# Patient Record
Sex: Female | Born: 1995 | Race: White | Hispanic: No | Smoking: Current every day smoker
Health system: Southern US, Community
[De-identification: ages and names within clinical notes are randomized; demographics above are authoritative.]

## PROBLEM LIST (undated history)

## (undated) ENCOUNTER — Inpatient Hospital Stay (HOSPITAL_COMMUNITY): Payer: Self-pay

## (undated) DIAGNOSIS — R87629 Unspecified abnormal cytological findings in specimens from vagina: Secondary | ICD-10-CM

## (undated) DIAGNOSIS — J45909 Unspecified asthma, uncomplicated: Secondary | ICD-10-CM

## (undated) DIAGNOSIS — B999 Unspecified infectious disease: Secondary | ICD-10-CM

## (undated) DIAGNOSIS — R51 Headache: Secondary | ICD-10-CM

## (undated) DIAGNOSIS — R519 Headache, unspecified: Secondary | ICD-10-CM

## (undated) DIAGNOSIS — Z789 Other specified health status: Secondary | ICD-10-CM

## (undated) DIAGNOSIS — F1910 Other psychoactive substance abuse, uncomplicated: Secondary | ICD-10-CM

## (undated) DIAGNOSIS — K219 Gastro-esophageal reflux disease without esophagitis: Secondary | ICD-10-CM

## (undated) DIAGNOSIS — F419 Anxiety disorder, unspecified: Secondary | ICD-10-CM

## (undated) DIAGNOSIS — F1110 Opioid abuse, uncomplicated: Secondary | ICD-10-CM

## (undated) HISTORY — PX: TONSILLECTOMY: SUR1361

---

## 2014-04-23 ENCOUNTER — Other Ambulatory Visit (HOSPITAL_COMMUNITY): Payer: Self-pay | Admitting: Obstetrics and Gynecology

## 2014-04-23 DIAGNOSIS — Z3A13 13 weeks gestation of pregnancy: Secondary | ICD-10-CM

## 2014-04-23 DIAGNOSIS — O283 Abnormal ultrasonic finding on antenatal screening of mother: Secondary | ICD-10-CM

## 2014-04-26 ENCOUNTER — Other Ambulatory Visit (HOSPITAL_COMMUNITY): Payer: Self-pay | Admitting: Obstetrics and Gynecology

## 2014-04-26 ENCOUNTER — Encounter (HOSPITAL_COMMUNITY): Payer: Self-pay

## 2014-04-26 ENCOUNTER — Ambulatory Visit (HOSPITAL_COMMUNITY)
Admission: RE | Admit: 2014-04-26 | Discharge: 2014-04-26 | Disposition: A | Discharge status: Home / Self Care | Payer: Medicaid Other | Source: Ambulatory Visit | Attending: Obstetrics and Gynecology | Admitting: Obstetrics and Gynecology

## 2014-04-26 ENCOUNTER — Encounter (HOSPITAL_COMMUNITY): Payer: Self-pay | Admitting: Obstetrics and Gynecology

## 2014-04-26 DIAGNOSIS — Z3A13 13 weeks gestation of pregnancy: Secondary | ICD-10-CM

## 2014-04-26 DIAGNOSIS — O358XX0 Maternal care for other (suspected) fetal abnormality and damage, not applicable or unspecified: Secondary | ICD-10-CM | POA: Insufficient documentation

## 2014-04-26 DIAGNOSIS — Z1389 Encounter for screening for other disorder: Secondary | ICD-10-CM | POA: Insufficient documentation

## 2014-04-26 DIAGNOSIS — O35DXX0 Maternal care for other (suspected) fetal abnormality and damage, fetal gastrointestinal anomalies, not applicable or unspecified: Secondary | ICD-10-CM | POA: Insufficient documentation

## 2014-04-26 DIAGNOSIS — O283 Abnormal ultrasonic finding on antenatal screening of mother: Secondary | ICD-10-CM | POA: Insufficient documentation

## 2014-04-26 DIAGNOSIS — O289 Unspecified abnormal findings on antenatal screening of mother: Secondary | ICD-10-CM | POA: Insufficient documentation

## 2014-04-26 HISTORY — DX: Other specified health status: Z78.9

## 2014-04-26 LAB — US MFM OB COMP LESS THAN 14 WEEKS

## 2014-04-26 NOTE — ED Notes (Signed)
Pt reports pelvic cramping.

## 2014-05-07 ENCOUNTER — Other Ambulatory Visit (HOSPITAL_COMMUNITY): Payer: Self-pay

## 2014-05-31 ENCOUNTER — Ambulatory Visit (HOSPITAL_COMMUNITY)
Admission: RE | Admit: 2014-05-31 | Discharge: 2014-05-31 | Disposition: A | Discharge status: Home / Self Care | Payer: Medicaid Other | Source: Ambulatory Visit | Attending: Obstetrics and Gynecology | Admitting: Obstetrics and Gynecology

## 2014-05-31 DIAGNOSIS — O358XX0 Maternal care for other (suspected) fetal abnormality and damage, not applicable or unspecified: Secondary | ICD-10-CM | POA: Insufficient documentation

## 2014-05-31 DIAGNOSIS — O289 Unspecified abnormal findings on antenatal screening of mother: Secondary | ICD-10-CM | POA: Diagnosis present

## 2014-05-31 DIAGNOSIS — O35DXX0 Maternal care for other (suspected) fetal abnormality and damage, fetal gastrointestinal anomalies, not applicable or unspecified: Secondary | ICD-10-CM

## 2014-05-31 DIAGNOSIS — O283 Abnormal ultrasonic finding on antenatal screening of mother: Secondary | ICD-10-CM

## 2014-05-31 DIAGNOSIS — Z3A13 13 weeks gestation of pregnancy: Secondary | ICD-10-CM | POA: Diagnosis not present

## 2014-05-31 DIAGNOSIS — Z3A18 18 weeks gestation of pregnancy: Secondary | ICD-10-CM | POA: Insufficient documentation

## 2014-05-31 DIAGNOSIS — Z3689 Encounter for other specified antenatal screening: Secondary | ICD-10-CM | POA: Insufficient documentation

## 2014-06-13 ENCOUNTER — Other Ambulatory Visit (HOSPITAL_COMMUNITY): Payer: Self-pay

## 2014-06-19 ENCOUNTER — Telehealth (HOSPITAL_COMMUNITY): Payer: Self-pay | Admitting: Genetics

## 2014-06-19 NOTE — Telephone Encounter (Signed)
Called Amanda Ballard to discuss the option of a pediatric surgery prenatal consult.  She stated that her doctor had recently [facility].  She stated that she was comfortable with those services and that [facility]. She stated she had no further questions or concerns.

## 2014-06-28 ENCOUNTER — Ambulatory Visit (HOSPITAL_COMMUNITY)
Admission: RE | Admit: 2014-06-28 | Discharge: 2014-06-28 | Disposition: A | Discharge status: Home / Self Care | Payer: Medicaid Other | Source: Ambulatory Visit | Attending: Obstetrics and Gynecology | Admitting: Obstetrics and Gynecology

## 2014-06-28 ENCOUNTER — Encounter (HOSPITAL_COMMUNITY): Payer: Self-pay

## 2014-06-28 DIAGNOSIS — O358XX0 Maternal care for other (suspected) fetal abnormality and damage, not applicable or unspecified: Secondary | ICD-10-CM

## 2014-06-28 DIAGNOSIS — IMO0002 Reserved for concepts with insufficient information to code with codable children: Secondary | ICD-10-CM | POA: Insufficient documentation

## 2014-06-28 DIAGNOSIS — Z36 Encounter for antenatal screening of mother: Secondary | ICD-10-CM | POA: Insufficient documentation

## 2014-06-28 DIAGNOSIS — O360920 Maternal care for other rhesus isoimmunization, second trimester, not applicable or unspecified: Secondary | ICD-10-CM | POA: Diagnosis not present

## 2014-06-28 DIAGNOSIS — Z3A22 22 weeks gestation of pregnancy: Secondary | ICD-10-CM | POA: Insufficient documentation

## 2014-06-28 DIAGNOSIS — O35DXX0 Maternal care for other (suspected) fetal abnormality and damage, fetal gastrointestinal anomalies, not applicable or unspecified: Secondary | ICD-10-CM

## 2014-06-28 DIAGNOSIS — Z0489 Encounter for examination and observation for other specified reasons: Secondary | ICD-10-CM | POA: Insufficient documentation

## 2014-06-28 DIAGNOSIS — O359XX0 Maternal care for (suspected) fetal abnormality and damage, unspecified, not applicable or unspecified: Secondary | ICD-10-CM | POA: Diagnosis not present

## 2014-08-05 ENCOUNTER — Inpatient Hospital Stay (HOSPITAL_COMMUNITY)
Admission: AD | Admit: 2014-08-05 | Discharge: 2014-08-05 | Disposition: A | Discharge status: Home / Self Care | Payer: Medicaid Other | Source: Ambulatory Visit | Attending: Obstetrics and Gynecology | Admitting: Obstetrics and Gynecology

## 2014-08-05 ENCOUNTER — Encounter (HOSPITAL_COMMUNITY): Payer: Self-pay | Admitting: *Deleted

## 2014-08-05 DIAGNOSIS — Z3A27 27 weeks gestation of pregnancy: Secondary | ICD-10-CM | POA: Diagnosis not present

## 2014-08-05 DIAGNOSIS — O368121 Decreased fetal movements, second trimester, fetus 1: Secondary | ICD-10-CM | POA: Diagnosis not present

## 2014-08-05 DIAGNOSIS — Z87891 Personal history of nicotine dependence: Secondary | ICD-10-CM | POA: Insufficient documentation

## 2014-08-05 DIAGNOSIS — O368120 Decreased fetal movements, second trimester, not applicable or unspecified: Secondary | ICD-10-CM | POA: Diagnosis present

## 2014-08-05 HISTORY — DX: Unspecified abnormal cytological findings in specimens from vagina: R87.629

## 2014-08-05 HISTORY — DX: Unspecified asthma, uncomplicated: J45.909

## 2014-08-05 HISTORY — DX: Unspecified infectious disease: B99.9

## 2014-08-05 HISTORY — DX: Gastro-esophageal reflux disease without esophagitis: K21.9

## 2014-08-05 HISTORY — DX: Headache: R51

## 2014-08-05 HISTORY — DX: Anxiety disorder, unspecified: F41.9

## 2014-08-05 HISTORY — DX: Headache, unspecified: R51.9

## 2014-08-05 LAB — RAPID URINE DRUG SCREEN, HOSP PERFORMED
Amphetamines: NOT DETECTED
Barbiturates: NOT DETECTED
Benzodiazepines: NOT DETECTED
Cocaine: NOT DETECTED
Opiates: NOT DETECTED
Tetrahydrocannabinol: NOT DETECTED

## 2014-08-05 MED ORDER — ONDANSETRON 8 MG PO TBDP
8.0000 mg | ORAL_TABLET | Freq: Two times a day (BID) | ORAL | Status: DC
  Administered 2014-08-05: 8 mg via ORAL
  Filled 2014-08-05: qty 1

## 2014-08-05 MED ORDER — PROMETHAZINE HCL 25 MG/ML IJ SOLN
25.0000 mg | INTRAMUSCULAR | Status: DC | PRN
  Administered 2014-08-05: 25 mg via INTRAMUSCULAR
  Filled 2014-08-05: qty 1

## 2014-08-05 NOTE — MAU Note (Signed)
PT ARRIVED- SAYS IN TRIAGE - BABY HAD NOT MOVED  SINCE YESTERDAY AFTERNOON-   UNABLE  TO GET [facility]- TOOK TO [facility]-  STILL UNABLE TO GET [facility]- CALLED DARLENE, CNM INTO    ROOM-    [facility]- 184 IN LEFT LOWER QUAD.   HAS HAD VOMITING  X2 HRS.    GETS [facility]-  IN [place]-  BABY HAS GASTRSHCESIS.-   COULD  SEE MOVEMENT IN TRIAGE.

## 2014-08-05 NOTE — MAU Provider Note (Signed)
  History   G1 at 27 wks in with c/o not feeling baby move since yesterday also c/o nauseas and vomiting since one hour ago. Is beeing seen in [facility] secondary to fetal gastroschesis.  CSN: [id number]  Arrival date and time: 08/05/14 2045   First Provider Initiated Contact with Patient 08/05/14 2100      No chief complaint on file.  HPI  OB History    Gravida Para Term Preterm AB TAB SAB Ectopic Multiple Living   1               Past Medical History  Diagnosis Date  . Medical history non-contributory     Past Surgical History  Procedure Laterality Date  . Tonsillectomy      No family history on file.  History  Substance Use Topics  . Smoking status: Former Games developermoker  . Smokeless tobacco: Not on file  . Alcohol Use: No    Allergies: No Known Allergies  Prescriptions prior to admission  Medication Sig Dispense Refill Last Dose  . CLONAZEPAM PO Take by mouth.   Taking  . Prenatal Vit-Fe Fumarate-FA (PRENATAL VITAMIN PO) Take by mouth.   Taking  . Sertraline HCl (ZOLOFT PO) Take by mouth.   Taking    Review of Systems  Constitutional: Negative.   HENT: Negative.   Eyes: Negative.   Respiratory: Negative.   Cardiovascular: Negative.   Gastrointestinal: Positive for nausea and vomiting.  Genitourinary: Negative.   Musculoskeletal: Negative.   Skin: Negative.   Neurological: Negative.   Endo/Heme/Allergies: Negative.   Psychiatric/Behavioral: Negative.    Physical Exam   Last menstrual period 01/24/2014.  Physical Exam  Constitutional: She is oriented to person, place, and time. She appears well-developed and well-nourished.  HENT:  Head: Normocephalic.  Eyes: Pupils are equal, round, and reactive to light.  Neck: Normal range of motion.  Cardiovascular: Normal rate, regular rhythm, normal heart sounds and intact distal pulses.   Respiratory: Effort normal and breath sounds normal.  GI: Soft. Bowel sounds are normal.  Neurological: She is alert and  oriented to person, place, and time. She has normal reflexes.  Skin: Skin is warm and dry.  Psychiatric: She has a normal mood and affect. Her behavior is normal. Judgment and thought content normal.    MAU Course  Procedures  MDM Decreased fetal movement.  Assessment and Plan  EFM and antiemetics  Garret Teale DARLENE 08/05/2014, 9:04 PM

## 2014-08-05 NOTE — MAU Note (Signed)
Pt states that the n/v is resolved. She is feeling baby move now, but just not as much as usual.

## 2014-08-05 NOTE — Discharge Instructions (Signed)

## 2014-08-19 ENCOUNTER — Inpatient Hospital Stay (HOSPITAL_COMMUNITY)
Admission: AD | Admit: 2014-08-19 | Discharge: 2014-08-19 | Disposition: A | Discharge status: Home / Self Care | Payer: Medicaid Other | Source: Ambulatory Visit | Attending: Obstetrics & Gynecology | Admitting: Obstetrics & Gynecology

## 2014-08-19 DIAGNOSIS — Z87891 Personal history of nicotine dependence: Secondary | ICD-10-CM | POA: Insufficient documentation

## 2014-08-19 DIAGNOSIS — F419 Anxiety disorder, unspecified: Secondary | ICD-10-CM | POA: Diagnosis not present

## 2014-08-19 DIAGNOSIS — N898 Other specified noninflammatory disorders of vagina: Secondary | ICD-10-CM | POA: Insufficient documentation

## 2014-08-19 DIAGNOSIS — O99343 Other mental disorders complicating pregnancy, third trimester: Secondary | ICD-10-CM | POA: Insufficient documentation

## 2014-08-19 DIAGNOSIS — O9989 Other specified diseases and conditions complicating pregnancy, childbirth and the puerperium: Secondary | ICD-10-CM | POA: Diagnosis not present

## 2014-08-19 DIAGNOSIS — B9689 Other specified bacterial agents as the cause of diseases classified elsewhere: Secondary | ICD-10-CM

## 2014-08-19 DIAGNOSIS — A499 Bacterial infection, unspecified: Secondary | ICD-10-CM | POA: Diagnosis not present

## 2014-08-19 DIAGNOSIS — N760 Acute vaginitis: Secondary | ICD-10-CM | POA: Diagnosis not present

## 2014-08-19 DIAGNOSIS — R109 Unspecified abdominal pain: Secondary | ICD-10-CM | POA: Insufficient documentation

## 2014-08-19 DIAGNOSIS — Z3A29 29 weeks gestation of pregnancy: Secondary | ICD-10-CM | POA: Insufficient documentation

## 2014-08-19 LAB — URINALYSIS, ROUTINE W REFLEX MICROSCOPIC
Bilirubin Urine: NEGATIVE
Glucose, UA: NEGATIVE mg/dL
Hgb urine dipstick: NEGATIVE
Ketones, ur: NEGATIVE mg/dL
Nitrite: NEGATIVE
PH: 5.5 (ref 5.0–8.0)
PROTEIN: NEGATIVE mg/dL
Specific Gravity, Urine: <1.005 — ABNORMAL LOW (ref 1.005–1.030)
Urobilinogen, UA: 0.2 mg/dL (ref 0.0–1.0)

## 2014-08-19 LAB — POCT FERN TEST: POCT Fern Test: NEGATIVE

## 2014-08-19 LAB — WET PREP, GENITAL
Trich, Wet Prep: NONE SEEN
Yeast Wet Prep HPF POC: NONE SEEN

## 2014-08-19 LAB — URINE MICROSCOPIC-ADD ON

## 2014-08-19 MED ORDER — METRONIDAZOLE 500 MG PO TABS: 500.0000 mg | ORAL_TABLET | Freq: Three times a day (TID) | ORAL | Status: DC

## 2014-08-19 NOTE — MAU Note (Signed)
Pts mother requesting AFI. Patient is not requesting an AFI and refuses an amnisure when offered.

## 2014-08-19 NOTE — Discharge Instructions (Signed)
Bacterial Vaginosis Bacterial vaginosis is a vaginal infection that occurs when the normal balance of bacteria in the vagina is disrupted. It results from an overgrowth of certain bacteria. This is the most common vaginal infection in women of childbearing age. Treatment is important to prevent complications, especially in pregnant women, as it can cause a premature delivery. CAUSES  Bacterial vaginosis is caused by an increase in harmful bacteria that are normally present in smaller amounts in the vagina. Several different kinds of bacteria can cause bacterial vaginosis. However, the reason that the condition develops is not fully understood. RISK FACTORS Certain activities or behaviors can put you at an increased risk of developing bacterial vaginosis, including:  Having a new sex partner or multiple sex partners.  Douching.  Using an intrauterine device (IUD) for contraception. Women do not get bacterial vaginosis from toilet seats, bedding, swimming pools, or contact with objects around them. SIGNS AND SYMPTOMS  Some women with bacterial vaginosis have no signs or symptoms. Common symptoms include:  Grey vaginal discharge.  A fishlike odor with discharge, especially after sexual intercourse.  Itching or burning of the vagina and vulva.  Burning or pain with urination. DIAGNOSIS  Your health care provider will take a medical history and examine the vagina for signs of bacterial vaginosis. A sample of vaginal fluid may be taken. Your health care provider will look at this sample under a microscope to check for bacteria and abnormal cells. A vaginal pH test may also be done.  TREATMENT  Bacterial vaginosis may be treated with antibiotic medicines. These may be given in the form of a pill or a vaginal cream. A second round of antibiotics may be prescribed if the condition comes back after treatment.  HOME CARE INSTRUCTIONS   Only take over-the-counter or prescription medicines as  directed by your health care provider.  If antibiotic medicine was prescribed, take it as directed. Make sure you finish it even if you start to feel better.  Do not have sex until treatment is completed.  Tell all sexual partners that you have a vaginal infection. They should see their health care provider and be treated if they have problems, such as a mild rash or itching.  Practice safe sex by using condoms and only having one sex partner. SEEK MEDICAL CARE IF:   Your symptoms are not improving after 3 days of treatment.  You have increased discharge or pain.  You have a fever. MAKE SURE YOU:   Understand these instructions.  Will watch your condition.  Will get help right away if you are not doing well or get worse. FOR MORE INFORMATION  Centers for Disease Control and Prevention, Division of STD Prevention: www.cdc.gov/std American Sexual Health Association (ASHA): www.ashastd.org  Document Released: 05/10/2005 Document Revised: 02/28/2013 Document Reviewed: 12/20/2012 ExitCare Patient Information 2015 [employer]. This information is not intended to replace advice given to you by your health care provider. Make sure you discuss any questions you have with your health care provider.  

## 2014-08-19 NOTE — MAU Note (Signed)
Pt reports she has had a lot of back pain and pressure today, at 1930 she had a gush of fluid.

## 2014-08-19 NOTE — MAU Provider Note (Signed)
  History   G1 at 29 wks in with c/o abd cramping, sharp shooting pain in vagina, and gush of fluid x 1 this afternoon. [facility] has [facility]hesis  CSN: [id number]  Arrival date and time: 08/19/14 1955   None     No chief complaint on file.  HPI  OB History    Gravida Para Term Preterm AB TAB SAB Ectopic Multiple Living   1               Past Medical History  Diagnosis Date  . Medical history non-contributory   . Anxiety   . Asthma   . Vaginal Pap smear, abnormal   . GERD (gastroesophageal reflux disease)   . Headache   . Infection     UTI    Past Surgical History  Procedure Laterality Date  . Tonsillectomy      No family history on file.  History  Substance Use Topics  . Smoking status: Former Games developermoker  . Smokeless tobacco: Never Used  . Alcohol Use: No     Comment: on occassion when not pregnant     Allergies: No Known Allergies  Prescriptions prior to admission  Medication Sig Dispense Refill Last Dose  . clonazePAM (KLONOPIN) 0.5 MG tablet Take 0.5 mg by mouth at bedtime as needed for anxiety (sleep).   08/05/2014 at Unknown time  . Prenatal Vit-Fe Fumarate-FA (PRENATAL VITAMIN PO) Take 1 tablet by mouth daily.    08/04/2014 at Unknown time  . sertraline (ZOLOFT) 25 MG tablet Take 37.5 mg by mouth daily.   08/05/2014 at Unknown time    Review of Systems  Constitutional: Negative.   HENT: Negative.   Eyes: Negative.   Respiratory: Negative.   Cardiovascular: Negative.   Gastrointestinal: Positive for abdominal pain.  Genitourinary:       Yellowish vag discharge, with ammine odor  Musculoskeletal: Negative.   Skin: Negative.   Neurological: Negative.   Endo/Heme/Allergies: Negative.   Psychiatric/Behavioral: Negative.    Physical Exam   Blood pressure 122/72, pulse 95, temperature 97.7 F (36.5 C), temperature source Oral, resp. rate 18, height 5\' 3"  (1.6 m), weight 130 lb 4 oz (59.081 kg), last menstrual period 01/24/2014,  SpO2 100 %.  Physical Exam  Constitutional: She is oriented to person, place, and time. She appears well-developed and well-nourished.  HENT:  Head: Normocephalic.  Eyes: Pupils are equal, round, and reactive to light.  Neck: Normal range of motion.  Cardiovascular: Normal rate, regular rhythm, normal heart sounds and intact distal pulses.   Respiratory: Effort normal and breath sounds normal.  GI: Soft. Bowel sounds are normal.  Genitourinary: Uterus normal.  Musculoskeletal: Normal range of motion.  Neurological: She is alert and oriented to person, place, and time. She has normal reflexes.  Skin: Skin is warm and dry.  Psychiatric: She has a normal mood and affect. Her behavior is normal. Judgment and thought content normal.    MAU Course  Procedures  MDM D/c home  Assessment and Plan  Sterile spec exam done no active leaking, no loss of fluid with valsalva or cough. Malodorous vag discharge, wet prep, gc, chla done. Cervix closed, th,post and high. Will d/c home  Wyvonnia DuskyLAWSON, Amanda Wellen DARLENE 08/19/2014, 8:23 PM

## 2014-08-19 NOTE — MAU Note (Signed)
Patient refuses RPR and HIV blood tests today

## 2014-08-20 LAB — GC/CHLAMYDIA PROBE AMP (~~LOC~~) NOT AT ARMC
CHLAMYDIA, DNA PROBE: NEGATIVE
NEISSERIA GONORRHEA: NEGATIVE

## 2015-03-01 ENCOUNTER — Encounter (HOSPITAL_COMMUNITY): Payer: Self-pay | Admitting: *Deleted

## 2015-12-18 ENCOUNTER — Ambulatory Visit: Payer: Medicaid Other | Admitting: Certified Nurse Midwife

## 2017-01-14 ENCOUNTER — Emergency Department (HOSPITAL_COMMUNITY)
Admission: EM | Admit: 2017-01-14 | Discharge: 2017-01-14 | Disposition: A | Discharge status: Home / Self Care | Payer: Medicaid Other | Attending: Emergency Medicine | Admitting: Emergency Medicine

## 2017-01-14 ENCOUNTER — Encounter (HOSPITAL_COMMUNITY): Payer: Self-pay | Admitting: Emergency Medicine

## 2017-01-14 DIAGNOSIS — Z5321 Procedure and treatment not carried out due to patient leaving prior to being seen by health care provider: Secondary | ICD-10-CM | POA: Diagnosis not present

## 2017-01-14 DIAGNOSIS — Z7151 Drug abuse counseling and surveillance of drug abuser: Secondary | ICD-10-CM | POA: Insufficient documentation

## 2017-01-14 LAB — COMPREHENSIVE METABOLIC PANEL
ALBUMIN: 4.2 g/dL (ref 3.5–5.0)
ALT: 28 U/L (ref 14–54)
ANION GAP: 8 (ref 5–15)
AST: 20 U/L (ref 15–41)
Alkaline Phosphatase: 63 U/L (ref 38–126)
BUN: 11 mg/dL (ref 6–20)
CO2: 28 mmol/L (ref 22–32)
CREATININE: 0.7 mg/dL (ref 0.44–1.00)
Calcium: 9.9 mg/dL (ref 8.9–10.3)
Chloride: 104 mmol/L (ref 101–111)
GFR calc Af Amer: >60 mL/min (ref >60)
GFR calc non Af Amer: >60 mL/min (ref >60)
GLUCOSE: 97 mg/dL (ref 65–99)
Potassium: 3.8 mmol/L (ref 3.5–5.1)
SODIUM: 140 mmol/L (ref 135–145)
Total Bilirubin: 0.5 mg/dL (ref 0.3–1.2)
Total Protein: 7.3 g/dL (ref 6.5–8.1)

## 2017-01-14 LAB — CBC
HCT: 42.2 % (ref 36.0–46.0)
Hemoglobin: 14 g/dL (ref 12.0–15.0)
MCH: 27.6 pg (ref 26.0–34.0)
MCHC: 33.2 g/dL (ref 30.0–36.0)
MCV: 83.1 fL (ref 78.0–100.0)
Platelets: 258 10*3/uL (ref 150–400)
RBC: 5.08 MIL/uL (ref 3.87–5.11)
RDW: 13 % (ref 11.5–15.5)
WBC: 6.2 10*3/uL (ref 4.0–10.5)

## 2017-01-14 LAB — RAPID URINE DRUG SCREEN, HOSP PERFORMED
Amphetamines: NOT DETECTED
Barbiturates: NOT DETECTED
Benzodiazepines: POSITIVE — AB
Cocaine: NOT DETECTED
Opiates: POSITIVE — AB
Tetrahydrocannabinol: NOT DETECTED

## 2017-01-14 LAB — ETHANOL: Alcohol, Ethyl (B): <5 mg/dL (ref <5)

## 2017-01-14 NOTE — ED Notes (Signed)
No answer when called, pt discharged out of system

## 2017-01-14 NOTE — ED Notes (Signed)
Called and no answer.

## 2017-01-14 NOTE — ED Provider Notes (Signed)
Pt reportedly here for detox. Pt's name was moved to Pod E room however never actually came back, per nursing notes she apparently left without being seen after triage, no answer on multiple calls in lobby. Pt was then discharged from the system. I DID NOT SEE THIS PT AS SHE LEFT WITHOUT BEING SEEN. Review of labs reveals unremarkable CMP and CBC, EtOH level undetectable, and UDS with +opiates and benzos. No concerning lab findings that would require calling her back to have her return. No further action required.   Signed at 6:11 PM by [address], [place], [place] 01/14/17 1812    Tegeler, Canary Brim, MD 01/14/17 [phone]

## 2017-01-14 NOTE — ED Triage Notes (Signed)
Pt states, "I need to get into a detox program before Monday." Pt reports being caught by police in possession of heroin yesterday, reports taking 6 xanax to "so the cops wouldn't find them."  Pt reports last heroin use yesterday. Pt's family friend reports pt taken to [facility] yesterday and released to find detox program.  Pt denies SI/ HI, hopeful for detox.

## 2017-01-14 NOTE — ED Notes (Signed)
Pt called for vital signs x4.  No response.

## 2017-03-09 ENCOUNTER — Encounter (HOSPITAL_COMMUNITY): Payer: Self-pay

## 2017-03-09 LAB — OB RESULTS CONSOLE ABO/RH: RH TYPE: NEGATIVE

## 2017-03-09 LAB — OB RESULTS CONSOLE PLATELET COUNT: Platelets: 207

## 2017-03-09 LAB — OB RESULTS CONSOLE ANTIBODY SCREEN: Antibody Screen: NEGATIVE

## 2017-03-09 LAB — OB RESULTS CONSOLE HGB/HCT, BLOOD
HCT: 37
Hemoglobin: 12.6

## 2017-03-09 LAB — OB RESULTS CONSOLE VARICELLA ZOSTER ANTIBODY, IGG: VARICELLA IGG: IMMUNE

## 2017-03-09 LAB — OB RESULTS CONSOLE HEPATITIS B SURFACE ANTIGEN: HEP B S AG: NEGATIVE

## 2017-03-09 LAB — OB RESULTS CONSOLE RUBELLA ANTIBODY, IGM: Rubella: IMMUNE

## 2017-03-09 LAB — OB RESULTS CONSOLE RPR: RPR: NONREACTIVE

## 2017-03-13 ENCOUNTER — Other Ambulatory Visit: Payer: Self-pay

## 2017-04-07 ENCOUNTER — Other Ambulatory Visit: Payer: Self-pay

## 2017-05-05 ENCOUNTER — Encounter (HOSPITAL_COMMUNITY): Payer: Self-pay

## 2017-05-06 ENCOUNTER — Encounter (HOSPITAL_COMMUNITY): Payer: Self-pay

## 2017-05-06 ENCOUNTER — Other Ambulatory Visit (HOSPITAL_COMMUNITY): Payer: Self-pay | Admitting: Obstetrics and Gynecology

## 2017-05-06 ENCOUNTER — Ambulatory Visit (HOSPITAL_COMMUNITY): Payer: Self-pay

## 2017-05-06 DIAGNOSIS — Z3689 Encounter for other specified antenatal screening: Secondary | ICD-10-CM

## 2017-05-10 ENCOUNTER — Encounter (HOSPITAL_COMMUNITY): Payer: Self-pay | Admitting: *Deleted

## 2017-05-12 ENCOUNTER — Ambulatory Visit (HOSPITAL_COMMUNITY)
Admission: RE | Admit: 2017-05-12 | Discharge: 2017-05-12 | Disposition: A | Discharge status: Home / Self Care | Payer: Medicaid Other | Source: Ambulatory Visit | Attending: Obstetrics and Gynecology | Admitting: Obstetrics and Gynecology

## 2017-05-12 ENCOUNTER — Encounter (HOSPITAL_COMMUNITY): Payer: Self-pay

## 2017-05-12 ENCOUNTER — Other Ambulatory Visit (HOSPITAL_COMMUNITY): Payer: Self-pay | Admitting: Obstetrics and Gynecology

## 2017-05-12 DIAGNOSIS — O99322 Drug use complicating pregnancy, second trimester: Secondary | ICD-10-CM | POA: Insufficient documentation

## 2017-05-12 DIAGNOSIS — O359XX0 Maternal care for (suspected) fetal abnormality and damage, unspecified, not applicable or unspecified: Secondary | ICD-10-CM

## 2017-05-12 DIAGNOSIS — O9989 Other specified diseases and conditions complicating pregnancy, childbirth and the puerperium: Secondary | ICD-10-CM | POA: Diagnosis not present

## 2017-05-12 DIAGNOSIS — Z3A15 15 weeks gestation of pregnancy: Secondary | ICD-10-CM | POA: Insufficient documentation

## 2017-05-12 DIAGNOSIS — J45909 Unspecified asthma, uncomplicated: Secondary | ICD-10-CM | POA: Diagnosis not present

## 2017-05-12 DIAGNOSIS — Z3689 Encounter for other specified antenatal screening: Secondary | ICD-10-CM

## 2017-05-12 HISTORY — DX: Other psychoactive substance abuse, uncomplicated: F19.10

## 2017-05-12 HISTORY — DX: Opioid abuse, uncomplicated: F11.10

## 2017-05-31 ENCOUNTER — Other Ambulatory Visit (HOSPITAL_COMMUNITY): Payer: Self-pay | Admitting: *Deleted

## 2017-05-31 DIAGNOSIS — O359XX0 Maternal care for (suspected) fetal abnormality and damage, unspecified, not applicable or unspecified: Secondary | ICD-10-CM

## 2017-06-07 ENCOUNTER — Encounter (HOSPITAL_COMMUNITY): Payer: Self-pay

## 2017-06-07 ENCOUNTER — Other Ambulatory Visit (HOSPITAL_COMMUNITY): Payer: Self-pay | Admitting: Maternal and Fetal Medicine

## 2017-06-07 ENCOUNTER — Ambulatory Visit (HOSPITAL_COMMUNITY)
Admission: RE | Admit: 2017-06-07 | Discharge: 2017-06-07 | Disposition: A | Discharge status: Home / Self Care | Payer: Medicaid Other | Source: Ambulatory Visit | Attending: Obstetrics and Gynecology | Admitting: Obstetrics and Gynecology

## 2017-06-07 ENCOUNTER — Ambulatory Visit (HOSPITAL_COMMUNITY): Admission: RE | Admit: 2017-06-07 | Payer: Medicaid Other | Source: Ambulatory Visit

## 2017-06-07 DIAGNOSIS — O09299 Supervision of pregnancy with other poor reproductive or obstetric history, unspecified trimester: Secondary | ICD-10-CM

## 2017-06-07 DIAGNOSIS — O9989 Other specified diseases and conditions complicating pregnancy, childbirth and the puerperium: Secondary | ICD-10-CM | POA: Diagnosis not present

## 2017-06-07 DIAGNOSIS — M2609 Other specified anomalies of jaw size: Secondary | ICD-10-CM | POA: Insufficient documentation

## 2017-06-07 DIAGNOSIS — Z362 Encounter for other antenatal screening follow-up: Secondary | ICD-10-CM

## 2017-06-07 DIAGNOSIS — J45909 Unspecified asthma, uncomplicated: Secondary | ICD-10-CM | POA: Diagnosis not present

## 2017-06-07 DIAGNOSIS — I517 Cardiomegaly: Secondary | ICD-10-CM | POA: Diagnosis not present

## 2017-06-07 DIAGNOSIS — Q270 Congenital absence and hypoplasia of umbilical artery: Secondary | ICD-10-CM | POA: Diagnosis not present

## 2017-06-07 DIAGNOSIS — G9389 Other specified disorders of brain: Secondary | ICD-10-CM | POA: Diagnosis not present

## 2017-06-07 DIAGNOSIS — O99352 Diseases of the nervous system complicating pregnancy, second trimester: Secondary | ICD-10-CM | POA: Insufficient documentation

## 2017-06-07 DIAGNOSIS — O418X20 Other specified disorders of amniotic fluid and membranes, second trimester, not applicable or unspecified: Secondary | ICD-10-CM | POA: Insufficient documentation

## 2017-06-07 DIAGNOSIS — O99322 Drug use complicating pregnancy, second trimester: Secondary | ICD-10-CM

## 2017-06-07 DIAGNOSIS — O358XX0 Maternal care for other (suspected) fetal abnormality and damage, not applicable or unspecified: Secondary | ICD-10-CM | POA: Diagnosis not present

## 2017-06-07 DIAGNOSIS — Z3A18 18 weeks gestation of pregnancy: Secondary | ICD-10-CM | POA: Insufficient documentation

## 2017-06-07 DIAGNOSIS — O359XX0 Maternal care for (suspected) fetal abnormality and damage, unspecified, not applicable or unspecified: Secondary | ICD-10-CM | POA: Insufficient documentation

## 2017-06-07 DIAGNOSIS — Q899 Congenital malformation, unspecified: Secondary | ICD-10-CM | POA: Diagnosis not present

## 2017-11-11 ENCOUNTER — Inpatient Hospital Stay (HOSPITAL_COMMUNITY)
Admission: EM | Admit: 2017-11-11 | Discharge: 2017-11-14 | DRG: 571 | Disposition: A | Discharge status: Home / Self Care | Payer: Self-pay | Attending: Internal Medicine | Admitting: Internal Medicine

## 2017-11-11 ENCOUNTER — Other Ambulatory Visit: Payer: Self-pay

## 2017-11-11 ENCOUNTER — Encounter (HOSPITAL_COMMUNITY): Payer: Self-pay

## 2017-11-11 DIAGNOSIS — F1190 Opioid use, unspecified, uncomplicated: Secondary | ICD-10-CM | POA: Diagnosis present

## 2017-11-11 DIAGNOSIS — F1310 Sedative, hypnotic or anxiolytic abuse, uncomplicated: Secondary | ICD-10-CM

## 2017-11-11 DIAGNOSIS — Z79891 Long term (current) use of opiate analgesic: Secondary | ICD-10-CM

## 2017-11-11 DIAGNOSIS — L03113 Cellulitis of right upper limb: Secondary | ICD-10-CM

## 2017-11-11 DIAGNOSIS — L02413 Cutaneous abscess of right upper limb: Principal | ICD-10-CM | POA: Diagnosis present

## 2017-11-11 DIAGNOSIS — K219 Gastro-esophageal reflux disease without esophagitis: Secondary | ICD-10-CM | POA: Diagnosis present

## 2017-11-11 DIAGNOSIS — F1990 Other psychoactive substance use, unspecified, uncomplicated: Secondary | ICD-10-CM | POA: Insufficient documentation

## 2017-11-11 DIAGNOSIS — Z79899 Other long term (current) drug therapy: Secondary | ICD-10-CM

## 2017-11-11 DIAGNOSIS — F1120 Opioid dependence, uncomplicated: Secondary | ICD-10-CM | POA: Diagnosis present

## 2017-11-11 DIAGNOSIS — F17210 Nicotine dependence, cigarettes, uncomplicated: Secondary | ICD-10-CM | POA: Diagnosis present

## 2017-11-11 DIAGNOSIS — F1910 Other psychoactive substance abuse, uncomplicated: Secondary | ICD-10-CM | POA: Diagnosis present

## 2017-11-11 DIAGNOSIS — F1410 Cocaine abuse, uncomplicated: Secondary | ICD-10-CM

## 2017-11-11 DIAGNOSIS — F419 Anxiety disorder, unspecified: Secondary | ICD-10-CM | POA: Diagnosis present

## 2017-11-11 DIAGNOSIS — F1199 Opioid use, unspecified with unspecified opioid-induced disorder: Secondary | ICD-10-CM | POA: Diagnosis present

## 2017-11-11 LAB — COMPREHENSIVE METABOLIC PANEL
ALK PHOS: 55 U/L (ref 38–126)
ALT: 22 U/L (ref 14–54)
ANION GAP: 10 (ref 5–15)
AST: 25 U/L (ref 15–41)
Albumin: 3.4 g/dL — ABNORMAL LOW (ref 3.5–5.0)
BILIRUBIN TOTAL: 0.3 mg/dL (ref 0.3–1.2)
BUN: 6 mg/dL (ref 6–20)
CALCIUM: 9 mg/dL (ref 8.9–10.3)
CO2: 24 mmol/L (ref 22–32)
Chloride: 106 mmol/L (ref 101–111)
Creatinine, Ser: 0.69 mg/dL (ref 0.44–1.00)
Glucose, Bld: 111 mg/dL — ABNORMAL HIGH (ref 65–99)
POTASSIUM: 3.5 mmol/L (ref 3.5–5.1)
Sodium: 140 mmol/L (ref 135–145)
TOTAL PROTEIN: 6.1 g/dL — AB (ref 6.5–8.1)

## 2017-11-11 LAB — CBC WITH DIFFERENTIAL/PLATELET
ABS IMMATURE GRANULOCYTES: 0.1 10*3/uL (ref 0.0–0.1)
BASOS PCT: 1 %
Basophils Absolute: 0 10*3/uL (ref 0.0–0.1)
Eosinophils Absolute: 0.3 10*3/uL (ref 0.0–0.7)
Eosinophils Relative: 4 %
HCT: 34.4 % — ABNORMAL LOW (ref 36.0–46.0)
HEMOGLOBIN: 10.3 g/dL — AB (ref 12.0–15.0)
Immature Granulocytes: 1 %
LYMPHS PCT: 23 %
Lymphs Abs: 2 10*3/uL (ref 0.7–4.0)
MCH: 24.3 pg — AB (ref 26.0–34.0)
MCHC: 29.9 g/dL — AB (ref 30.0–36.0)
MCV: 81.1 fL (ref 78.0–100.0)
MONO ABS: 0.8 10*3/uL (ref 0.1–1.0)
Monocytes Relative: 9 %
NEUTROS ABS: 5.6 10*3/uL (ref 1.7–7.7)
Neutrophils Relative %: 63 %
PLATELETS: 307 10*3/uL (ref 150–400)
RBC: 4.24 MIL/uL (ref 3.87–5.11)
RDW: 17.4 % — ABNORMAL HIGH (ref 11.5–15.5)
WBC: 8.8 10*3/uL (ref 4.0–10.5)

## 2017-11-11 LAB — I-STAT BETA HCG BLOOD, ED (MC, WL, AP ONLY)

## 2017-11-11 LAB — I-STAT CG4 LACTIC ACID, ED: Lactic Acid, Venous: 1.32 mmol/L (ref 0.5–1.9)

## 2017-11-11 NOTE — ED Triage Notes (Addendum)
Pt endorses infection to right elbow x 3 days, swelling x 2 days from the elbow to the hand. Pt thinks she got bit by a spider, went to [facility] yesterday and was placed on antibiotics and d/c home. Swelling and pain is worse today and green pus noted in wound. Afebrile.

## 2017-11-12 ENCOUNTER — Encounter (HOSPITAL_COMMUNITY): Payer: Self-pay | Admitting: Internal Medicine

## 2017-11-12 DIAGNOSIS — F1199 Opioid use, unspecified with unspecified opioid-induced disorder: Secondary | ICD-10-CM | POA: Diagnosis present

## 2017-11-12 DIAGNOSIS — F1190 Opioid use, unspecified, uncomplicated: Secondary | ICD-10-CM | POA: Diagnosis present

## 2017-11-12 DIAGNOSIS — F1410 Cocaine abuse, uncomplicated: Secondary | ICD-10-CM

## 2017-11-12 DIAGNOSIS — F1990 Other psychoactive substance use, unspecified, uncomplicated: Secondary | ICD-10-CM | POA: Insufficient documentation

## 2017-11-12 DIAGNOSIS — L03113 Cellulitis of right upper limb: Secondary | ICD-10-CM | POA: Diagnosis present

## 2017-11-12 DIAGNOSIS — F1310 Sedative, hypnotic or anxiolytic abuse, uncomplicated: Secondary | ICD-10-CM

## 2017-11-12 LAB — MRSA PCR SCREENING: MRSA by PCR: NEGATIVE

## 2017-11-12 LAB — RAPID URINE DRUG SCREEN, HOSP PERFORMED
AMPHETAMINES: NOT DETECTED
BENZODIAZEPINES: POSITIVE — AB
Cocaine: POSITIVE — AB
Opiates: NOT DETECTED
Tetrahydrocannabinol: NOT DETECTED

## 2017-11-12 LAB — I-STAT CG4 LACTIC ACID, ED: Lactic Acid, Venous: 0.58 mmol/L (ref 0.5–1.9)

## 2017-11-12 MED ORDER — SODIUM CHLORIDE 0.9 % IV SOLN
INTRAVENOUS | Status: DC
  Administered 2017-11-12 – 2017-11-13 (×3): via INTRAVENOUS

## 2017-11-12 MED ORDER — VANCOMYCIN HCL 500 MG IV SOLR
500.0000 mg | Freq: Two times a day (BID) | INTRAVENOUS | Status: DC
  Administered 2017-11-12 – 2017-11-14 (×4): 500 mg via INTRAVENOUS
  Filled 2017-11-12 (×6): qty 500

## 2017-11-12 MED ORDER — VANCOMYCIN HCL IN DEXTROSE 1-5 GM/200ML-% IV SOLN
1000.0000 mg | Freq: Once | INTRAVENOUS | Status: AC
  Administered 2017-11-12: 1000 mg via INTRAVENOUS
  Filled 2017-11-12: qty 200

## 2017-11-12 MED ORDER — ONDANSETRON HCL 4 MG/2ML IJ SOLN: 4.0000 mg | Freq: Four times a day (QID) | INTRAMUSCULAR | Status: DC | PRN

## 2017-11-12 MED ORDER — QUETIAPINE FUMARATE 50 MG PO TABS
150.0000 mg | ORAL_TABLET | Freq: Every day | ORAL | Status: DC
  Administered 2017-11-12 – 2017-11-13 (×2): 150 mg via ORAL
  Filled 2017-11-12 (×2): qty 1

## 2017-11-12 MED ORDER — CARBAMAZEPINE 100 MG PO CHEW
100.0000 mg | CHEWABLE_TABLET | Freq: Three times a day (TID) | ORAL | Status: DC
  Filled 2017-11-12: qty 1

## 2017-11-12 MED ORDER — HYDROMORPHONE HCL 2 MG/ML IJ SOLN: 0.5000 mg | Freq: Once | INTRAMUSCULAR | Status: DC

## 2017-11-12 MED ORDER — ACETAMINOPHEN 650 MG RE SUPP: 650.0000 mg | Freq: Four times a day (QID) | RECTAL | Status: DC | PRN

## 2017-11-12 MED ORDER — HYDROMORPHONE HCL 1 MG/ML IJ SOLN
0.5000 mg | Freq: Once | INTRAMUSCULAR | Status: AC
  Administered 2017-11-12: 0.5 mg via INTRAVENOUS
  Filled 2017-11-12: qty 1

## 2017-11-12 MED ORDER — ACETAMINOPHEN 325 MG PO TABS
650.0000 mg | ORAL_TABLET | Freq: Four times a day (QID) | ORAL | Status: DC | PRN
  Administered 2017-11-12 – 2017-11-14 (×3): 650 mg via ORAL
  Filled 2017-11-12 (×3): qty 2

## 2017-11-12 MED ORDER — HYDROMORPHONE HCL 2 MG/ML IJ SOLN
1.0000 mg | Freq: Once | INTRAMUSCULAR | Status: AC
Start: 2017-11-12 — End: 2017-11-12
  Administered 2017-11-12: 1 mg via INTRAVENOUS
  Filled 2017-11-12: qty 1

## 2017-11-12 MED ORDER — POLYETHYLENE GLYCOL 3350 17 G PO PACK: 17.0000 g | PACK | Freq: Every day | ORAL | Status: DC | PRN

## 2017-11-12 MED ORDER — VENLAFAXINE HCL 25 MG PO TABS: 25.0000 mg | ORAL_TABLET | Freq: Two times a day (BID) | ORAL | Status: DC

## 2017-11-12 MED ORDER — HYDROMORPHONE HCL 1 MG/ML IJ SOLN
0.5000 mg | INTRAMUSCULAR | Status: DC | PRN
  Administered 2017-11-12: 0.5 mg via INTRAVENOUS
  Filled 2017-11-12: qty 1

## 2017-11-12 MED ORDER — HYDROXYZINE HCL 25 MG PO TABS
50.0000 mg | ORAL_TABLET | Freq: Three times a day (TID) | ORAL | Status: DC | PRN
  Administered 2017-11-12 (×2): 50 mg via ORAL
  Filled 2017-11-12 (×3): qty 1

## 2017-11-12 MED ORDER — GABAPENTIN 600 MG PO TABS
300.0000 mg | ORAL_TABLET | Freq: Three times a day (TID) | ORAL | Status: DC
  Filled 2017-11-12: qty 0.5

## 2017-11-12 MED ORDER — ONDANSETRON HCL 4 MG PO TABS: 4.0000 mg | ORAL_TABLET | Freq: Four times a day (QID) | ORAL | Status: DC | PRN

## 2017-11-12 MED ORDER — HYDROMORPHONE HCL 2 MG/ML IJ SOLN
0.5000 mg | INTRAMUSCULAR | Status: DC | PRN
  Administered 2017-11-12 (×4): 0.5 mg via INTRAVENOUS
  Filled 2017-11-12 (×4): qty 1

## 2017-11-12 MED ORDER — ENOXAPARIN SODIUM 40 MG/0.4ML ~~LOC~~ SOLN
40.0000 mg | SUBCUTANEOUS | Status: DC
  Administered 2017-11-12 – 2017-11-14 (×2): 40 mg via SUBCUTANEOUS
  Filled 2017-11-12 (×2): qty 0.4

## 2017-11-12 MED ORDER — HYDROMORPHONE HCL 2 MG/ML IJ SOLN
1.0000 mg | INTRAMUSCULAR | Status: DC | PRN
  Administered 2017-11-13 (×2): 1 mg via INTRAVENOUS
  Filled 2017-11-12 (×2): qty 1

## 2017-11-12 NOTE — ED Provider Notes (Signed)
[facility] EMERGENCY DEPARTMENT Provider Note   CSN: [id number] Arrival date & time: 11/11/17  2058     History   Chief Complaint Chief Complaint  Patient presents with  . Arm Pain  . Cellulitis    HPI Amanda Ballard is a 22 y.o. female.  Patient presents with right arm pain, redness and swelling that started 3 days ago at the posterior elbow. No fever, nausea or vomiting. She was seen and evaluated in the emergency department yesterday and started on Bactrim. She has taken 4 doses total. Throughout the day today symptoms became worse, including the spread of the swelling and redness distally to hand, and significantly more pain. She denies IV drug use. She feels like she may have been bitten by a spider/insect.   The history is provided by the patient. No language interpreter was used.  Arm Pain     Past Medical History:  Diagnosis Date  . Anxiety   . Asthma   . GERD (gastroesophageal reflux disease)   . Headache   . Heroin abuse (HCC)   . Infection    UTI  . Medical history non-contributory   . Substance abuse (HCC)   . Vaginal Pap smear, abnormal     Patient Active Problem List   Diagnosis Date Noted  . [redacted] weeks gestation of pregnancy   . Evaluate anatomy not seen on prior sonogram   . Encounter for fetal anatomic survey   . [redacted] weeks gestation of pregnancy   . [redacted] weeks gestation of pregnancy   . Abnormal fetal ultrasound   . Pregnancy complicated by fetal abdominal abnormality   . Gastroschisis, fetal, affecting care of mother, antepartum   . Encounter for routine screening for malformation using ultrasonics     Past Surgical History:  Procedure Laterality Date  . TONSILLECTOMY       OB History    Gravida  2   Para  1   Term  1   Preterm      AB      Living  1     SAB      TAB      Ectopic      Multiple      Live Births               Home Medications    Prior to Admission medications   Medication Sig  Start Date End Date Taking? Authorizing Provider  ibuprofen (ADVIL,MOTRIN) 800 MG tablet Take 800 mg by mouth 3 (three) times daily. 06/17/17 11/20/17 Yes [provider]  sulfamethoxazole-trimethoprim (BACTRIM DS,SEPTRA DS) 800-160 MG tablet Take 1 tablet by mouth 2 (two) times daily. 11/10/17 11/17/17 Yes [provider]  traMADol (ULTRAM) 50 MG tablet Take 50 mg by mouth every 6 (six) hours as needed. 09/07/17  Yes [provider]  metroNIDAZOLE (FLAGYL) 500 MG tablet Take 1 tablet (500 mg total) by mouth 3 (three) times daily. Patient not taking: Reported on 05/12/2017 08/19/14   Montez Morita, CNM    Family History History reviewed. No pertinent family history.  Social History Social History   Tobacco Use  . Smoking status: Current Every Day Smoker    Packs/day: 0.50    Years: 0.00    Pack years: 0.00    Types: Cigarettes    Last attempt to quit: 05/13/2015    Years since quitting: 2.5  . Smokeless tobacco: Never Used  Substance Use Topics  . Alcohol use: No  .  Drug use: Yes    Types: IV, Heroin, Benzodiazepines    Comment: last heroin use 2 weeks ago     Allergies   Patient has no known allergies.   Review of Systems Review of Systems  Constitutional: Negative for fever.  Gastrointestinal: Negative for nausea and vomiting.  Musculoskeletal:       See HPI.  Skin: Positive for color change and wound.  Neurological: Negative for weakness and numbness.     Physical Exam Updated Vital Signs BP 105/67 (BP Location: Left Arm)   Pulse 93   Temp 98 F (36.7 C) (Oral)   Resp 18   Ht 5\' 3"  (1.6 m)   Wt 43.5 kg (96 lb)   LMP 10/08/2017 (Approximate)   SpO2 99%   Breastfeeding? Unknown   BMI 17.01 kg/m   Physical Exam  Constitutional: She is oriented to person, place, and time. She appears well-developed and well-nourished.  Neck: Normal range of motion.  Pulmonary/Chest: Effort normal.  Musculoskeletal:  Right elbow held in  flexion/adduction. FROM wrist without significant increase to pain at elbow on wrist flexion or extension. There is significant swelling to elbow, forearm, wrist and hand with open wound to posterior elbow draining purulent material.   Neurological: She is alert and oriented to person, place, and time.  Skin: Skin is warm and dry. Capillary refill takes less than 2 seconds. There is erythema.     ED Treatments / Results  Labs (all labs ordered are listed, but only abnormal results are displayed) Labs Reviewed  COMPREHENSIVE METABOLIC PANEL - Abnormal; Notable for the following components:      Result Value   Glucose, Bld 111 (*)    Total Protein 6.1 (*)    Albumin 3.4 (*)    All other components within normal limits  CBC WITH DIFFERENTIAL/PLATELET - Abnormal; Notable for the following components:   Hemoglobin 10.3 (*)    HCT 34.4 (*)    MCH 24.3 (*)    MCHC 29.9 (*)    RDW 17.4 (*)    All other components within normal limits  I-STAT CG4 LACTIC ACID, ED  I-STAT BETA HCG BLOOD, ED ([facility], [facility], [facility] ONLY)  I-STAT CG4 LACTIC ACID, ED   Results for orders placed or performed during the hospital encounter of 11/11/17  Comprehensive metabolic panel  Result Value Ref Range   Sodium 140 135 - 145 mmol/L   Potassium 3.5 3.5 - 5.1 mmol/L   Chloride 106 101 - 111 mmol/L   CO2 24 22 - 32 mmol/L   Glucose, Bld 111 (H) 65 - 99 mg/dL   BUN 6 6 - 20 mg/dL   Creatinine, Ser 1.61 0.44 - 1.00 mg/dL   Calcium 9.0 8.9 - 09.6 mg/dL   Total Protein 6.1 (L) 6.5 - 8.1 g/dL   Albumin 3.4 (L) 3.5 - 5.0 g/dL   AST 25 15 - 41 U/L   ALT 22 14 - 54 U/L   Alkaline Phosphatase 55 38 - 126 U/L   Total Bilirubin 0.3 0.3 - 1.2 mg/dL   GFR calc non Af Amer >60 >60 mL/min   GFR calc Af Amer >60 >60 mL/min   Anion gap 10 5 - 15  CBC with Differential  Result Value Ref Range   WBC 8.8 4.0 - 10.5 K/uL   RBC 4.24 3.87 - 5.11 MIL/uL   Hemoglobin 10.3 (L) 12.0 - 15.0 g/dL   HCT 04.5 (L) 40.9 - 81.1 %   MCV 81.1  78.0 -  100.0 fL   MCH 24.3 (L) 26.0 - 34.0 pg   MCHC 29.9 (L) 30.0 - 36.0 g/dL   RDW 16.117.4 (H) 09.611.5 - 04.515.5 %   Platelets 307 150 - 400 K/uL   Neutrophils Relative % 63 %   Neutro Abs 5.6 1.7 - 7.7 K/uL   Lymphocytes Relative 23 %   Lymphs Abs 2.0 0.7 - 4.0 K/uL   Monocytes Relative 9 %   Monocytes Absolute 0.8 0.1 - 1.0 K/uL   Eosinophils Relative 4 %   Eosinophils Absolute 0.3 0.0 - 0.7 K/uL   Basophils Relative 1 %   Basophils Absolute 0.0 0.0 - 0.1 K/uL   Immature Granulocytes 1 %   Abs Immature Granulocytes 0.1 0.0 - 0.1 K/uL  I-Stat CG4 Lactic Acid, ED  Result Value Ref Range   Lactic Acid, Venous 1.32 0.5 - 1.9 mmol/L  I-Stat beta hCG blood, ED  Result Value Ref Range   I-stat hCG, quantitative <5.0 <5 mIU/mL   Comment 3            EKG None  Radiology No results found.  Procedures Procedures (including critical care time)  Medications Ordered in ED Medications - No data to display   Initial Impression / Assessment and Plan / ED Course  I have reviewed the triage vital signs and the nursing notes.  Pertinent labs & imaging results that were available during my care of the patient were reviewed by me and considered in my medical decision making (see chart for details).     Patient with infection to right arm with abscess at elbow and significant swelling/redness distally to hand. On abx x 24 hours with worsening symptoms.   Will attempt to open the abscess to a greater degree, start IV Vanc and admit for continuation of IV abx.   Discussed I&D with the patient with intent to definitively open the abscessed area posterior elbow and the patient stated she could not tolerate the procedure right now. Pain is improved with pain medication while at rest.   Discussed with Internal Medicine for unassigned admission, who accepts the patient onto their surface.   Final Clinical Impressions(s) / ED Diagnoses   Final diagnoses:  None   1. Right elbow abscess 2.  Right UE cellulitis   ED Discharge Orders    None       Elpidio AnisUpstill, Gisel Vipond, PA-C 11/12/17 0721    Melene PlanFloyd, Dan, DO 11/12/17 [id number]

## 2017-11-12 NOTE — H&P (Signed)
Date: 11/12/2017               Patient Name:  Amanda Ballard MRN: [id number]  DOB: June 30, 1995 Age / Sex: 22 y.o., female   PCP: Patient, No Pcp Per         Medical Service: Internal Medicine Teaching Service         Attending Physician: Dr. Oswaldo Ballard, Amanda Ballard, *    First Contact: Dr. Evelene Ballard  Pager: [phone]  Second Contact: Dr. Vincente Ballard  Pager: [phone]       After Hours (After 5p/  First Contact Pager: [phone]  weekends / holidays): Second Contact Pager: [phone]   Chief Complaint: Right elbow pain   History of Present Illness:  Amanda Ballard is a 23 year-old female with history of polysubstance abuse including IV heroin and benzodiazepines who presents with a 2-day history of right el bow pain. She noticed erythema and warmth over skin and went to the ED on 6/21 where she had an I&D for R elbow abscess, one dose of IV clindamycin and was discharged with Bactrim of which she took 2 doses. She presents today with diffuse erythema and swelling above elbow and on forearm as well as pain that worsens with flexion of elbow. She reports high grade fever of 102.1 at home. She denies any open wounds or recent trauma. However, EDP noted purulent drainage from open wound. She declined I &D in the ED. She initially denied history of substance abuse and reported previous use of IV heroin with last use "a long time ago." She does not use clean needles consistently and states she only injects in her L arm. She was recently admitted to [facility] [place] on 6/10 for detox and was discharged on 6/13. She has been on Suboxone before, 24 QD. She is currently homeless, staying at different motels with boyfriend.   ED course: Afebrile and hemodynamically stable in the ED. Blood work remarkable for Hgb 10.3 with MCV 81. Received Dilaudid for pain and was started on Vancomycin.   Meds:  Current Meds  Medication Sig  . ibuprofen (ADVIL,MOTRIN) 800 MG tablet Take 800 mg by mouth 3 (three) times daily.  . traMADol  (ULTRAM) 50 MG tablet Take 50 mg by mouth every 6 (six) hours as needed.  . [DISCONTINUED] sulfamethoxazole-trimethoprim (BACTRIM DS,SEPTRA DS) 800-160 MG tablet Take 1 tablet by mouth 2 (two) times daily.    Allergies: Allergies as of 11/11/2017  . (No Known Allergies)   Past Medical History:  Diagnosis Date  . Anxiety   . Asthma   . GERD (gastroesophageal reflux disease)   . Headache   . Heroin abuse (HCC)   . Infection    UTI  . Medical history non-contributory   . Substance abuse (HCC)   . Vaginal Pap smear, abnormal     Family History:  Father - congenital heart disease   Social History:  Social History   Tobacco Use  . Smoking status: Current Every Day Smoker    Packs/day: 0.50    Years: 0.00    Pack years: 0.00    Types: Cigarettes    Last attempt to quit: 05/13/2015    Years since quitting: 2.5  . Smokeless tobacco: Never Used  Substance Use Topics  . Alcohol use: No  . Drug use: Yes    Types: IV, Heroin, Benzodiazepines    Comment: last heroin use 2 weeks ago    Review of Systems: A complete ROS was negative except as  per HPI.   Physical Exam: Blood pressure 98/66, pulse 77, temperature 98 F (36.7 C), temperature source Oral, resp. rate 18, height 5\' 3"  (1.6 m), weight 96 lb (43.5 kg), last menstrual period 10/08/2017, SpO2 100 %, unknown if currently breastfeeding. Physical Exam  Constitutional: She is oriented to person, place, and time.  Chronically ill appearing female sleeping in bed in no acute distress  HENT:  Head: Normocephalic and atraumatic.  Eyes: Conjunctivae are normal.  Neck: Normal range of motion. Neck supple.  Cardiovascular:  RRR, nl S1/S2, no murmurs   Pulmonary/Chest: Effort normal and breath sounds normal. No respiratory distress. She has no wheezes.  Musculoskeletal:  There is swelling, warmth and erythema at R elbow that extends down the forearm. There is fluctuance on posterior elbow around an area of open skin with  visible purulence. There is also induration across dorsal surface of arm. She is exquisitely tender to the touch around posterior elbow. ROM is limited at R elbow. Full ROM at R wrist and hand.   Neurological: She is alert and oriented to person, place, and time.  Skin:  Needle track marks noted on LUE     EKG: none obtained   CXR: none obtained    Assessment & Plan by Problem: Principal Problem:   Cellulitis of right arm Active Problems:   Polysubstance dependence (HCC)  # R elbow abscess associated with cellulitis: Patient presented with worsening pain, swelling and erythema around L elbow after I&D yesterday performed at Urgent Care at which time she was discharged on PO antibiotics. Limited ROM on R elbow and intact at R wrist and hand. Currently afebrile and HDS. Ortho consulted with plan for I&D tomorrow.  - Ortho consulted - Continue vancomycin  - Dilaudid 0.5 mg q3h PRN for pain  - NPO at [facility]   # Polysubstance dependence: Long history of IV heroin and benzodiazepine use. UDS also positive for cocaine. Has been on Suboxone before and remained clean for 2 months.  - Hydroxyzine 50 mg TID PRN for anxiety  - Dilaudid for pain as above  - Social work consulted for resources   F: none  E: replete PRN  N: NPO at [facility]   VTE ppx: enoxaparin   Code status: Full code, not confirmed on admission     Dispo: Admit patient to Inpatient with expected length of stay greater than 2 midnights.  SignedBurna Cash: Amanda Ballard, Amanda Gaeta, MD 11/12/2017, 9:37 AM  Pager: [phone]

## 2017-11-12 NOTE — Progress Notes (Signed)
Pharmacy Antibiotic Note  Amanda Ballard is a 22 y.o. female admitted on 11/11/2017 with elbow abscess.  Pharmacy has been consulted for vancomycin dosing.  Day #1 of vancomycin for R elbow abscess / cellulitis. Refused I&D in the ED. Afebrile, WBC wnl.  Plan: Start vancomycin 500mg  IV Q12h Monitor clinical picture, renal function, VT prn F/U C&S, abx deescalation / LOT  Height: 5\' 3"  (160 cm) Weight: 96 lb (43.5 kg) IBW/kg (Calculated) : 52.4  Temp (24hrs), Avg:98.1 F (36.7 C), Min:97.8 F (36.6 C), Max:98.6 F (37 C)  Recent Labs  Lab 11/11/17 2145 11/11/17 2156 11/12/17 0916  WBC 8.8  --   --   CREATININE 0.69  --   --   LATICACIDVEN  --  1.32 0.58    Estimated Creatinine Clearance: 75.7 mL/min (by C-G formula based on SCr of 0.69 mg/dL).    No Known Allergies   Thank you for allowing pharmacy to be a part of this patient's care.  Armandina StammerBATCHELDER,Tresea Heine J 11/12/2017 1:10 PM

## 2017-11-12 NOTE — Progress Notes (Signed)
Patient was evaluated for right pain and swelling. According to patient Dilaudid is not completely helping with the pain at this time. Patient appears very drowsy and there was a  prescription bottle containing Valium was next to patient with someone named Rosey Batheresa on it.  According to patient and her boyfriend it belongs to her mother, she denies any recent use out of that bottle.  She does has benzos positive UDS.  When asked to keep that part of with pharmacy, boyfriend took the bottle and went to keep it in the car.  Patient and boyfriend was both explained that she cannot have any other medicine in the room and we can keep it safe and return if there them on discharge they prefer to keep that bottle with them but will keep it in the car not in the room.  On exam patient has marked edema of right hand and forearm. Radial pulse 2+ bilaterally. She denies any tingling or numbness.  -Elevate right hand. -Gave her an extra dose of Dilaudid. -Increase Dilaudid to 1 mg every 4 hourly as needed. -She has a planned I&D tomorrow morning by orthopedic surgery. -If Developed compartment syndrome overnight we will consult orthopedic surgery again.

## 2017-11-12 NOTE — Consult Note (Signed)
Reason for Consult:  Right arm abscess/infection Referring Physician: Internal Medicine Teaching Service  Amanda Ballard is an 22 y.o. female.  HPI: The patient is a 22 year old female who apparently presented to an outlying facility yesterday with right arm redness with fluctuance and an obvious infection/abscess.  Apparently an attempt was made to perform just at bedside I&D and she was sent on oral antibiotics.  She then presented early this morning to the [facility] emergency room and was admitted to the internal medicine teaching service for IV antibiotics.  Orthopedic surgery is just consulted this afternoon for possible surgical evaluation with an irrigation and debridement of her right arm abscess.  The patient has not been n.p.o. and just ate mac & cheese less than an hour ago.  She does report right arm pain and some drainage.  She is uncertain as to where this is coming from in terms of the source.  Past Medical History:  Diagnosis Date  . Anxiety   . Asthma   . GERD (gastroesophageal reflux disease)   . Headache   . Heroin abuse ([place])   . Infection    UTI  . Medical history non-contributory   . Substance abuse ([place])   . Vaginal Pap smear, abnormal     Past Surgical History:  Procedure Laterality Date  . TONSILLECTOMY      History reviewed. No pertinent family history.  Social History:  reports that she has been smoking cigarettes.  She has been smoking about 0.50 packs per day for the past 0.00 years. She has never used smokeless tobacco. She reports that she has current or past drug history. Drugs: IV, Heroin, and Benzodiazepines. She reports that she does not drink alcohol.  Allergies: No Known Allergies  Medications: I have reviewed the patient's current medications.  Results for orders placed or performed during the hospital encounter of 11/11/17 (from the past 48 hour(s))  Comprehensive metabolic panel     Status: Abnormal   Collection Time: 11/11/17  9:45 PM   Result Value Ref Range   Sodium 140 135 - 145 mmol/L   Potassium 3.5 3.5 - 5.1 mmol/L   Chloride 106 101 - 111 mmol/L   CO2 24 22 - 32 mmol/L   Glucose, Bld 111 (H) 65 - 99 mg/dL   BUN 6 6 - 20 mg/dL   Creatinine, Ser 0.69 0.44 - 1.00 mg/dL   Calcium 9.0 8.9 - 10.3 mg/dL   Total Protein 6.1 (L) 6.5 - 8.1 g/dL   Albumin 3.4 (L) 3.5 - 5.0 g/dL   AST 25 15 - 41 U/L   ALT 22 14 - 54 U/L   Alkaline Phosphatase 55 38 - 126 U/L   Total Bilirubin 0.3 0.3 - 1.2 mg/dL   GFR calc non Af Amer >60 >60 mL/min   GFR calc Af Amer >60 >60 mL/min    Comment: (NOTE) The eGFR has been calculated using the CKD EPI equation. This calculation has not been validated in all clinical situations. eGFR's persistently <60 mL/min signify possible Chronic Kidney Disease.    Anion gap 10 5 - 15    Comment: Performed at [facility] [address] [place], [place] [address]  CBC with Differential     Status: Abnormal   Collection Time: 11/11/17  9:45 PM  Result Value Ref Range   WBC 8.8 4.0 - 10.5 K/uL   RBC 4.24 3.87 - 5.11 MIL/uL   Hemoglobin 10.3 (L) 12.0 - 15.0 g/dL  HCT 34.4 (L) 36.0 - 46.0 %   MCV 81.1 78.0 - 100.0 fL   MCH 24.3 (L) 26.0 - 34.0 pg   MCHC 29.9 (L) 30.0 - 36.0 g/dL   RDW 17.4 (H) 11.5 - 15.5 %   Platelets 307 150 - 400 K/uL   Neutrophils Relative % 63 %   Neutro Abs 5.6 1.7 - 7.7 K/uL   Lymphocytes Relative 23 %   Lymphs Abs 2.0 0.7 - 4.0 K/uL   Monocytes Relative 9 %   Monocytes Absolute 0.8 0.1 - 1.0 K/uL   Eosinophils Relative 4 %   Eosinophils Absolute 0.3 0.0 - 0.7 K/uL   Basophils Relative 1 %   Basophils Absolute 0.0 0.0 - 0.1 K/uL   Immature Granulocytes 1 %   Abs Immature Granulocytes 0.1 0.0 - 0.1 K/uL    Comment: Performed at [facility] [address], [place], [place] [address]  I-Stat beta hCG blood, ED     Status: None   Collection Time: 11/11/17  9:54 PM  Result Value Ref Range   I-stat hCG, quantitative <5.0 <5 mIU/mL   Comment 3             Comment:   GEST. AGE      CONC.  (mIU/mL)   <=1 WEEK        5 - 50     2 WEEKS       50 - 500     3 WEEKS       100 - 10,000     4 WEEKS     1,000 - 30,000        FEMALE AND NON-PREGNANT FEMALE:     LESS THAN 5 mIU/mL   I-Stat CG4 Lactic Acid, ED     Status: None   Collection Time: 11/11/17  9:56 PM  Result Value Ref Range   Lactic Acid, Venous 1.32 0.5 - 1.9 mmol/L  I-Stat CG4 Lactic Acid, ED     Status: None   Collection Time: 11/12/17  9:16 AM  Result Value Ref Range   Lactic Acid, Venous 0.58 0.5 - 1.9 mmol/L    No results found.  ROS Blood pressure 108/60, pulse 82, temperature 98.1 F (36.7 C), temperature source Oral, resp. rate 16, height _0  (1.6 m), weight 96 lb (43.5 kg), last menstrual period 10/08/2017, SpO2 100 %, unknown if currently breastfeeding. Physical Exam  Constitutional: She is oriented to person, place, and time. She appears well-developed and well-nourished.  HENT:  Head: Normocephalic and atraumatic.  Eyes: Pupils are equal, round, and reactive to light. EOM are normal.  Neck: Normal range of motion. Neck supple.  Cardiovascular: Normal rate.  Respiratory: Effort normal.  GI: Soft. Bowel sounds are normal.  Musculoskeletal:       Arms: Neurological: She is alert and oriented to person, place, and time.  Skin: Skin is warm and dry.  Psychiatric: She has a normal mood and affect.   There is a small wound on the posterior aspect of her elbow there is very tender to touch with associated redness around this.  There is another firm area just dorsal to this in the forearm.  There is no evidence of compartment syndrome.  Her hand is well-perfused and she moves her fingers and thumb easily.  There is firmness and induration at the proximal forearm dorsally.   Assessment/Plan: Right arm infection/abscess  Given the severe pain she is having and the obvious clinical signs of an  abscess, this warrants a formal irrigation debridement and an operative  setting under some type of anesthetic but will allow her to be comfortable while this is done.  I explained in detail the risk and benefits of this surgery and the reasoning behind this.  Since she has just eaten and has a full stomach we will delay surgery until tomorrow morning.  She will be made n.p.o. after midnight tonight.  Mcarthur Rossetti 11/12/2017, 3:14 PM

## 2017-11-13 ENCOUNTER — Encounter (HOSPITAL_COMMUNITY)
Admission: EM | Disposition: A | Discharge status: Home / Self Care | Payer: Self-pay | Source: Home / Self Care | Attending: Student in an Organized Health Care Education/Training Program

## 2017-11-13 ENCOUNTER — Inpatient Hospital Stay (HOSPITAL_COMMUNITY): Payer: Medicaid Other | Admitting: Certified Registered"

## 2017-11-13 ENCOUNTER — Encounter (HOSPITAL_COMMUNITY): Payer: Self-pay | Admitting: Certified Registered"

## 2017-11-13 HISTORY — PX: I & D EXTREMITY: SHX5045

## 2017-11-13 LAB — COMPREHENSIVE METABOLIC PANEL
ALT: 37 U/L (ref 14–54)
AST: 37 U/L (ref 15–41)
Albumin: 2.7 g/dL — ABNORMAL LOW (ref 3.5–5.0)
Alkaline Phosphatase: 53 U/L (ref 38–126)
Anion gap: 5 (ref 5–15)
BILIRUBIN TOTAL: 0.3 mg/dL (ref 0.3–1.2)
CHLORIDE: 111 mmol/L (ref 101–111)
CO2: 27 mmol/L (ref 22–32)
Calcium: 8.5 mg/dL — ABNORMAL LOW (ref 8.9–10.3)
Creatinine, Ser: 0.52 mg/dL (ref 0.44–1.00)
Glucose, Bld: 108 mg/dL — ABNORMAL HIGH (ref 65–99)
Potassium: 3.4 mmol/L — ABNORMAL LOW (ref 3.5–5.1)
Sodium: 143 mmol/L (ref 135–145)
TOTAL PROTEIN: 5.2 g/dL — AB (ref 6.5–8.1)

## 2017-11-13 LAB — CBC
HCT: 29.6 % — ABNORMAL LOW (ref 36.0–46.0)
Hemoglobin: 9.1 g/dL — ABNORMAL LOW (ref 12.0–15.0)
MCH: 24.1 pg — ABNORMAL LOW (ref 26.0–34.0)
MCHC: 30.7 g/dL (ref 30.0–36.0)
MCV: 78.5 fL (ref 78.0–100.0)
Platelets: 264 10*3/uL (ref 150–400)
RBC: 3.77 MIL/uL — AB (ref 3.87–5.11)
RDW: 17 % — AB (ref 11.5–15.5)
WBC: 3.8 10*3/uL — AB (ref 4.0–10.5)

## 2017-11-13 LAB — HIV ANTIBODY (ROUTINE TESTING W REFLEX): HIV Screen 4th Generation wRfx: NONREACTIVE

## 2017-11-13 SURGERY — IRRIGATION AND DEBRIDEMENT EXTREMITY
Anesthesia: General | Site: Arm Upper | Laterality: Right

## 2017-11-13 MED ORDER — MIDAZOLAM HCL 5 MG/5ML IJ SOLN
INTRAMUSCULAR | Status: DC | PRN
  Administered 2017-11-13: 1.5 mg via INTRAVENOUS

## 2017-11-13 MED ORDER — POTASSIUM CHLORIDE CRYS ER 20 MEQ PO TBCR
40.0000 meq | EXTENDED_RELEASE_TABLET | Freq: Once | ORAL | Status: DC
  Filled 2017-11-13: qty 2

## 2017-11-13 MED ORDER — PROPOFOL 10 MG/ML IV BOLUS
INTRAVENOUS | Status: DC | PRN
  Administered 2017-11-13: 170 mg via INTRAVENOUS

## 2017-11-13 MED ORDER — SODIUM CHLORIDE 0.9 % IR SOLN
Status: DC | PRN
  Administered 2017-11-13 (×2): 1000 mL

## 2017-11-13 MED ORDER — OXYCODONE HCL 5 MG PO TABS
5.0000 mg | ORAL_TABLET | ORAL | Status: DC | PRN
  Administered 2017-11-13 – 2017-11-14 (×4): 10 mg via ORAL
  Filled 2017-11-13 (×4): qty 2

## 2017-11-13 MED ORDER — HYDROMORPHONE HCL 2 MG/ML IJ SOLN
2.0000 mg | INTRAMUSCULAR | Status: DC | PRN
  Administered 2017-11-13 – 2017-11-14 (×6): 2 mg via INTRAVENOUS
  Filled 2017-11-13 (×6): qty 1

## 2017-11-13 MED ORDER — OXYCODONE HCL 5 MG PO TABS: 5.0000 mg | ORAL_TABLET | Freq: Once | ORAL | Status: DC | PRN

## 2017-11-13 MED ORDER — BUPIVACAINE HCL (PF) 0.25 % IJ SOLN
INTRAMUSCULAR | Status: AC
  Filled 2017-11-13: qty 30

## 2017-11-13 MED ORDER — LACTATED RINGERS IV SOLN
INTRAVENOUS | Status: DC | PRN
  Administered 2017-11-13: 07:00:00 via INTRAVENOUS

## 2017-11-13 MED ORDER — DEXAMETHASONE SODIUM PHOSPHATE 4 MG/ML IJ SOLN
INTRAMUSCULAR | Status: DC | PRN
  Administered 2017-11-13: 4 mg via INTRAVENOUS

## 2017-11-13 MED ORDER — GLYCOPYRROLATE 0.2 MG/ML IJ SOLN
INTRAMUSCULAR | Status: DC | PRN
  Administered 2017-11-13: 0.1 mg via INTRAVENOUS

## 2017-11-13 MED ORDER — FENTANYL CITRATE (PF) 100 MCG/2ML IJ SOLN
INTRAMUSCULAR | Status: AC
  Filled 2017-11-13: qty 2

## 2017-11-13 MED ORDER — FENTANYL CITRATE (PF) 100 MCG/2ML IJ SOLN
25.0000 ug | INTRAMUSCULAR | Status: DC | PRN
  Administered 2017-11-13 (×4): 25 ug via INTRAVENOUS

## 2017-11-13 MED ORDER — BUPIVACAINE HCL (PF) 0.25 % IJ SOLN
INTRAMUSCULAR | Status: DC | PRN
  Administered 2017-11-13: 10 mL

## 2017-11-13 MED ORDER — FENTANYL CITRATE (PF) 250 MCG/5ML IJ SOLN
INTRAMUSCULAR | Status: AC
  Filled 2017-11-13: qty 5

## 2017-11-13 MED ORDER — MIDAZOLAM HCL 2 MG/2ML IJ SOLN
INTRAMUSCULAR | Status: AC
  Filled 2017-11-13: qty 2

## 2017-11-13 MED ORDER — OXYCODONE HCL 5 MG/5ML PO SOLN: 5.0000 mg | Freq: Once | ORAL | Status: DC | PRN

## 2017-11-13 MED ORDER — FENTANYL CITRATE (PF) 100 MCG/2ML IJ SOLN
INTRAMUSCULAR | Status: DC | PRN
  Administered 2017-11-13 (×2): 50 ug via INTRAVENOUS

## 2017-11-13 MED ORDER — ONDANSETRON HCL 4 MG/2ML IJ SOLN: 4.0000 mg | Freq: Once | INTRAMUSCULAR | Status: DC | PRN

## 2017-11-13 MED ORDER — ONDANSETRON HCL 4 MG/2ML IJ SOLN
INTRAMUSCULAR | Status: DC | PRN
  Administered 2017-11-13: 4 mg via INTRAVENOUS

## 2017-11-13 MED ORDER — PROPOFOL 10 MG/ML IV BOLUS
INTRAVENOUS | Status: AC
  Filled 2017-11-13: qty 40

## 2017-11-13 MED ORDER — HYDROMORPHONE HCL 2 MG/ML IJ SOLN
1.0000 mg | Freq: Once | INTRAMUSCULAR | Status: AC
  Administered 2017-11-13: 1 mg via INTRAVENOUS
  Filled 2017-11-13: qty 1

## 2017-11-13 MED ORDER — LIDOCAINE HCL (CARDIAC) PF 100 MG/5ML IV SOSY
PREFILLED_SYRINGE | INTRAVENOUS | Status: DC | PRN
  Administered 2017-11-13: 50 mg via INTRAVENOUS

## 2017-11-13 SURGICAL SUPPLY — 53 items
BANDAGE ACE 3X5.8 VEL STRL LF (GAUZE/BANDAGES/DRESSINGS) IMPLANT
BANDAGE ACE 4X5 VEL STRL LF (GAUZE/BANDAGES/DRESSINGS) ×2 IMPLANT
BLADE SURG 10 STRL SS (BLADE) ×3 IMPLANT
BNDG COHESIVE 4X5 TAN STRL (GAUZE/BANDAGES/DRESSINGS) ×3 IMPLANT
BNDG COHESIVE 6X5 TAN STRL LF (GAUZE/BANDAGES/DRESSINGS) ×2 IMPLANT
COVER SURGICAL LIGHT HANDLE (MISCELLANEOUS) ×3 IMPLANT
CUFF TOURNIQUET SINGLE 18IN (TOURNIQUET CUFF) ×1 IMPLANT
CUFF TOURNIQUET SINGLE 34IN LL (TOURNIQUET CUFF) IMPLANT
DRAPE ORTHO SPLIT 77X108 STRL (DRAPES) ×3
DRAPE SURG 17X23 STRL (DRAPES) IMPLANT
DRAPE SURG ORHT 6 SPLT 77X108 (DRAPES) ×2 IMPLANT
DRAPE U-SHAPE 47X51 STRL (DRAPES) ×3 IMPLANT
DRSG PAD ABDOMINAL 8X10 ST (GAUZE/BANDAGES/DRESSINGS) ×2 IMPLANT
DURAPREP 26ML APPLICATOR (WOUND CARE) ×1 IMPLANT
ELECT REM PT RETURN 9FT ADLT (ELECTROSURGICAL) ×3
ELECTRODE REM PT RTRN 9FT ADLT (ELECTROSURGICAL) IMPLANT
GAUZE SPONGE 4X4 12PLY STRL (GAUZE/BANDAGES/DRESSINGS) ×3 IMPLANT
GAUZE XEROFORM 1X8 LF (GAUZE/BANDAGES/DRESSINGS) ×3 IMPLANT
GLOVE BIO SURGEON STRL SZ8 (GLOVE) ×3 IMPLANT
GLOVE BIOGEL PI IND STRL 8 (GLOVE) ×2 IMPLANT
GLOVE BIOGEL PI INDICATOR 8 (GLOVE) ×4
GLOVE ORTHO TXT STRL SZ7.5 (GLOVE) ×3 IMPLANT
GOWN STRL REUS W/ TWL LRG LVL3 (GOWN DISPOSABLE) ×1 IMPLANT
GOWN STRL REUS W/ TWL XL LVL3 (GOWN DISPOSABLE) ×2 IMPLANT
GOWN STRL REUS W/TWL LRG LVL3 (GOWN DISPOSABLE) ×3
GOWN STRL REUS W/TWL XL LVL3 (GOWN DISPOSABLE) ×6
HANDPIECE INTERPULSE COAX TIP (DISPOSABLE)
KIT BASIN OR (CUSTOM PROCEDURE TRAY) ×3 IMPLANT
KIT TURNOVER KIT B (KITS) ×3 IMPLANT
MANIFOLD NEPTUNE II (INSTRUMENTS) ×3 IMPLANT
NDL HYPO 25GX1X1/2 BEV (NEEDLE) IMPLANT
NEEDLE HYPO 25GX1X1/2 BEV (NEEDLE) ×3 IMPLANT
NS IRRIG 1000ML POUR BTL (IV SOLUTION) ×3 IMPLANT
PACK ORTHO EXTREMITY (CUSTOM PROCEDURE TRAY) ×3 IMPLANT
PAD ARMBOARD 7.5X6 YLW CONV (MISCELLANEOUS) ×6 IMPLANT
PAD CAST 4YDX4 CTTN HI CHSV (CAST SUPPLIES) IMPLANT
PADDING CAST ABS 4INX4YD NS (CAST SUPPLIES)
PADDING CAST ABS COTTON 4X4 ST (CAST SUPPLIES) ×2 IMPLANT
PADDING CAST COTTON 4X4 STRL (CAST SUPPLIES) ×3
PADDING CAST COTTON 6X4 STRL (CAST SUPPLIES) ×1 IMPLANT
SET HNDPC FAN SPRY TIP SCT (DISPOSABLE) IMPLANT
SPONGE LAP 18X18 X RAY DECT (DISPOSABLE) ×5 IMPLANT
STOCKINETTE IMPERVIOUS 9X36 MD (GAUZE/BANDAGES/DRESSINGS) ×5 IMPLANT
SUT ETHILON 2 0 FS 18 (SUTURE) ×4 IMPLANT
SUT ETHILON 3 0 PS 1 (SUTURE) IMPLANT
SWAB CULTURE ESWAB REG 1ML (MISCELLANEOUS) IMPLANT
SYR CONTROL 10ML LL (SYRINGE) ×2 IMPLANT
TOWEL OR 17X24 6PK STRL BLUE (TOWEL DISPOSABLE) ×3 IMPLANT
TOWEL OR 17X26 10 PK STRL BLUE (TOWEL DISPOSABLE) ×3 IMPLANT
TUBE CONNECTING 12'X1/4 (SUCTIONS) ×1
TUBE CONNECTING 12X1/4 (SUCTIONS) ×2 IMPLANT
UNDERPAD 30X30 (UNDERPADS AND DIAPERS) ×3 IMPLANT
YANKAUER SUCT BULB TIP NO VENT (SUCTIONS) ×3 IMPLANT

## 2017-11-13 NOTE — Anesthesia Postprocedure Evaluation (Signed)
Anesthesia Post Note  Patient: Amanda Ballard  Procedure(s) Performed: IRRIGATION AND DEBRIDEMENT ARM (Right Arm Upper)     Patient location during evaluation: PACU Anesthesia Type: General Level of consciousness: awake and alert Pain management: pain level controlled Vital Signs Assessment: post-procedure vital signs reviewed and stable Respiratory status: spontaneous breathing, nonlabored ventilation, respiratory function stable and patient connected to nasal cannula oxygen Cardiovascular status: blood pressure returned to baseline and stable Postop Assessment: no apparent nausea or vomiting Anesthetic complications: no    Last Vitals:  Vitals:   11/13/17 1447 11/13/17 2115  BP: 105/67 117/67  Pulse: 97 (!) 102  Resp: 16   Temp: 36.7 C 36.7 C  SpO2: 100% 100%    Last Pain:  Vitals:   11/13/17 2115  TempSrc: Oral  PainSc:                  [name] COKER

## 2017-11-13 NOTE — Op Note (Signed)
NAME: Amanda Ballard, Amanda M. MEDICAL RECORD [id number] ACCOUNT [id number] DATE OF BIRTH:1996-04-29 FACILITY: [facility] LOCATION: [facility] PHYSICIAN:Adamariz Gillott Aretha ParrotY. Celena Lanius, MD  OPERATIVE REPORT  DATE OF PROCEDURE:  11/13/2017  PREOPERATIVE DIAGNOSIS:  Right elbow and forearm infection with cellulitis and abscess.  POSTOPERATIVE DIAGNOSIS:  Right elbow and forearm infection with cellulitis and abscess.  PROCEDURE:  Irrigation and debridement of right elbow and forearm abscess with sharp excisional debridement of necrotic skin and fascia only over an area of 5-7 cm.  FINDINGS:  Gross purulence in the fascia and subfascial of the right elbow and proximal dorsal forearm.  SURGEON:  Doneen Poissonhristopher Ernestene Coover, MD  ASSISTANT:  Rexene EdisonGil Clark PA-C  ANESTHESIA:   1.  General. 2.  Local with plain Marcaine.  ESTIMATED BLOOD LOSS:  Less than 50 mL.  COMPLICATIONS:  None.  INDICATIONS:  The patient is a 22 year old female who has developed some type of infection involving her right proximal forearm dorsally and at the olecranon area.  She had been seen in an outlying facility and they had performed a minimal irrigation  with an incision at a small area of her elbow a few days ago.  She did not tolerate that well and put her on oral antibiotics.  [facility] with continued redness, cellulitis and pain.  I was consulted yesterday  for further evaluation and treatment of this area.  I found indurated skin and an area that obviously needed irrigation and debridement.  I explained the risks and benefits to her and the reasoning behind proceeding with surgery.  DESCRIPTION OF PROCEDURE:  After informed consent was obtained,  appropriate right arm was marked.  She was brought to the operating room and placed supine on the operating table with the right arm on an arm table.  General anesthesia was then obtained.   Her right forearm, upper arm, elbow down to the wrist were  prepped and draped with Betadine scrub and paint.  Sterile stockinette was used around her hand and wrist.  A timeout was called and she was identified as the correct patient, the correct right  arm.  I then used a #15 blade and made an incision over the wound at her distal olecranon area of the forearm and carried this proximally and distally and found some gross purulence in this area.  Using a #15 blade, I sharply ellipsed necrotic skin from  the wound and removed necrotic fascia with the #15 blade and a rongeur.  I then made a separate incision on the dorsal forearm and was able to decompress the edema and purulence in this area as well.  I did find some gross purulence.  This only was in  the fascial layer and did not track to the muscles either.  After I opened up these 2 wounds.  I then irrigated 2 liters of normal saline solution used a bulb syringe to the soft tissue.  We then loosely.  I reapproximated both skin incisions with  interrupted 2-0 nylon suture.  We infiltrated the incisions with plain Marcaine and then placed Xeroform well-padded sterile dressing.    She was awakened, extubated, and taken to recovery room in stable condition.  All final counts were correct.  There were no complications noted.  AN/NUANCE  D:11/13/2017 T:11/13/2017 JOB:[id number]

## 2017-11-13 NOTE — Progress Notes (Signed)
The patient's father approached the nursing staff and reported that he thinks that the patient's boyfriend is "shooting up heroin" in the bathroom. The father felt like the person's demeanor changed drastically, drowsiness and slurred speech. The primary nurse (Cynthia_ notified the charge nurse Lowanda Foster(Brittany) and the Assist. Director was notified.  The Administrator Co.(Kim) was notified.  Dr Magnus IvanBlackman was notified of the situation.  Security is here.  The patient allowed for her belongings to be searched but the boyfriend refused. A sitter will be provided at the bedside.

## 2017-11-13 NOTE — Transfer of Care (Signed)
Immediate Anesthesia Transfer of Care Note  Patient: Amanda Ballard  Procedure(s) Performed: IRRIGATION AND DEBRIDEMENT ARM (Right Arm Upper)  Patient Location: PACU  Anesthesia Type:General  Level of Consciousness: alert , oriented and patient cooperative  Airway & Oxygen Therapy: Patient Spontanous Breathing  Post-op Assessment: Report given to RN and Post -op Vital signs reviewed and stable  Post vital signs: Reviewed and stable  Last Vitals:  Vitals Value Taken Time  BP 104/77 11/13/2017  8:37 AM  Temp    Pulse 84 11/13/2017  8:39 AM  Resp 10 11/13/2017  8:39 AM  SpO2 100 % 11/13/2017  8:39 AM  Vitals shown include unvalidated device data.  Last Pain:  Vitals:   11/13/17 0600  TempSrc:   PainSc: Asleep         Complications: No apparent anesthesia complications

## 2017-11-13 NOTE — Anesthesia Preprocedure Evaluation (Addendum)
Anesthesia Evaluation  Patient identified by MRN, date of birth, ID band Patient awake    Reviewed: Allergy & Precautions, NPO status , Patient's Chart, lab work & pertinent test results  Airway Mallampati: II  TM Distance: >3 FB Neck ROM: Full    Dental  (+) Teeth Intact, Dental Advisory Given   Pulmonary Current Smoker,    breath sounds clear to auscultation       Cardiovascular  Rhythm:Regular Rate:Normal     Neuro/Psych    GI/Hepatic   Endo/Other    Renal/GU      Musculoskeletal   Abdominal   Peds  Hematology   Anesthesia Other Findings   Reproductive/Obstetrics                             Anesthesia Physical Anesthesia Plan  ASA: II  Anesthesia Plan: General   Post-op Pain Management:    Induction: Intravenous  PONV Risk Score and Plan: Ondansetron and Dexamethasone  Airway Management Planned: Oral ETT  Additional Equipment:   Intra-op Plan:   Post-operative Plan: Extubation in OR  Informed Consent: I have reviewed the patients History and Physical, chart, labs and discussed the procedure including the risks, benefits and alternatives for the proposed anesthesia with the patient or authorized representative who has indicated his/her understanding and acceptance.     Dental advisory given  Plan Discussed with: CRNA and Anesthesiologist  Anesthesia Plan Comments:         Anesthesia Quick Evaluation  

## 2017-11-13 NOTE — Progress Notes (Signed)
   Subjective:  No acute events overnight. Patient reporting R elbow pain that is not relieved with Dilaudid. Otherwise feeling well.   Objective:  Vital signs in last 24 hours: Vitals:   11/13/17 0915 11/13/17 0930 11/13/17 0945 11/13/17 1007  BP:  112/75  103/82  Pulse: 84 87 81 93  Resp: 12 11 13 14   Temp:   97.9 F (36.6 C) (!) 97.5 F (36.4 C)  TempSrc:    Oral  SpO2: 100% 100% 100% 100%  Weight:      Height:       Physical Exam  Constitutional: She is oriented to person, place, and time and well-developed, well-nourished, and in no distress.  Cardiovascular: Normal rate, regular rhythm and normal heart sounds. Exam reveals no gallop and no friction rub.  No murmur heard. Pulmonary/Chest: Effort normal and breath sounds normal. No respiratory distress. She has no wheezes. She has no rales.  Musculoskeletal: She exhibits edema (R hand swollen and less erythematous ).  RUE is bandaged   Neurological: She is alert and oriented to person, place, and time.    Assessment/Plan:  Principal Problem:   Cellulitis of right arm Active Problems:   Opioid use disorder (HCC)  # R elbow abscess associated with cellulitis: s/p POD #1 surgical debridement. Gross purulence noted down to fascia, no muscle or bone involvement. Dr. Magnus IvanBlackman recommended IV antibiotics for 1 more day and transition to PO Bactrim vs doxycyline.  - Ortho following, appreciate assistance  - Continue vancomycin  - Dilaudid 1-->2 mg q4h PRN + oxycodone 5-10 mg q3h PRN for pain  - Follow up [facility]   # Opioid use disorder: Declined Suboxone  - Hydroxyzine 50 mg TID PRN for anxiety  - Dilaudid and oxycodone for pain as above    Dispo: Anticipated discharge in approximately 1-2 day(s).   Burna CashSantos-Sanchez, Elven Laboy, MD 11/13/2017, 11:55 AM Pager: [phone]

## 2017-11-13 NOTE — Progress Notes (Signed)
Patient removed her dressing from her surgical right arm.  Patient putting her fingers at the incision and attempting to scratch.  Patient was given instructions not to remove the dressing and putting her finger around the incision.  Patient states "its itching and my hands are clean".  Dressing was replaced and clean ace wrap placed.  A sitter is at the bedside and told the patient to not remove the dressing.  The patient is noncompliant.

## 2017-11-13 NOTE — Progress Notes (Signed)
Patient's father suspected that patient (daughter) and boyfriend are using drugs (heroin) in room. RN for the patient came to this RN Lowanda Foster(Sanii Kukla,RN) and stated the situation. Annice NeedyBrittany Magaly Pollina, RN [facility] and [facility] trying to find out policy and procedure for this situation. Hassie BruceKim, AC stated that security and police office are able to go into room and ask patient for permission to search room and patient. Patient stated that they can search the room, patient, but the boyfriend refused search of self. Security had a talk with patient about care, and the situation of drug use in the hospital. Patient verbalized understanding of situation, and stated " I just want to get better." Patient was assigned safety sitter, until further evaluation.

## 2017-11-13 NOTE — Brief Op Note (Signed)
11/11/2017 - 11/13/2017  8:20 AM  PATIENT:  Amanda Ballard  21 y.o. female  PRE-OPERATIVE DIAGNOSIS:  right arm abcess  POST-OPERATIVE DIAGNOSIS:  right arm abcess  PROCEDURE:  Procedure(s): IRRIGATION AND DEBRIDEMENT ARM (Right)  SURGEON:  Surgeon(s) and Role:    Kathryne Hitch* Kirrah Mustin Y, MD - Primary  PHYSICIAN ASSISTANT: Rexene EdisonGil Clark, PA-C  ANESTHESIA:   local and general  EBL: minimal  LOCAL MEDICATIONS USED:  MARCAINE     COUNTS:  YES  TOURNIQUET: none  DICTATION: .Other Dictation: Dictation Number [id number]  PLAN OF CARE: Admit to inpatient   PATIENT DISPOSITION:  PACU - hemodynamically stable.   Delay start of Pharmacological VTE agent (>24hrs) due to surgical blood loss or risk of bleeding: no

## 2017-11-13 NOTE — Progress Notes (Signed)
Patient ID: Amanda Ballard, female   DOB: 06-Mar-1996, 22 y.o.   MRN: [id number] The patient understands that we are proceeding to surgery today for an irrigation/debridement of her right arm abscess. Risks and benefits have been discussed and informed consent is obtained.

## 2017-11-13 NOTE — Anesthesia Procedure Notes (Signed)
Procedure Name: LMA Insertion Date/Time: 11/13/2017 7:50 AM Performed by: Wilder GladeWinn, Tahra Hitzeman G, CRNA Pre-anesthesia Checklist: Emergency Drugs available, Patient identified, Patient being monitored, Suction available and Timeout performed Patient Re-evaluated:Patient Re-evaluated prior to induction Oxygen Delivery Method: Circle system utilized Preoxygenation: Pre-oxygenation with 100% oxygen Induction Type: IV induction LMA: LMA with gastric port inserted LMA Size: 3.0 Number of attempts: 1 Placement Confirmation: positive ETCO2 and breath sounds checked- equal and bilateral ETT to lip (cm): yes. Tube secured with: Tape Dental Injury: Teeth and Oropharynx as per pre-operative assessment

## 2017-11-14 ENCOUNTER — Emergency Department (HOSPITAL_COMMUNITY)
Admission: EM | Admit: 2017-11-14 | Discharge: 2017-11-14 | Disposition: A | Discharge status: Home / Self Care | Payer: Medicaid Other | Attending: Emergency Medicine | Admitting: Emergency Medicine

## 2017-11-14 ENCOUNTER — Encounter (HOSPITAL_COMMUNITY): Payer: Self-pay | Admitting: Orthopaedic Surgery

## 2017-11-14 DIAGNOSIS — F17210 Nicotine dependence, cigarettes, uncomplicated: Secondary | ICD-10-CM | POA: Insufficient documentation

## 2017-11-14 DIAGNOSIS — J45909 Unspecified asthma, uncomplicated: Secondary | ICD-10-CM | POA: Insufficient documentation

## 2017-11-14 DIAGNOSIS — Z638 Other specified problems related to primary support group: Secondary | ICD-10-CM | POA: Insufficient documentation

## 2017-11-14 DIAGNOSIS — Z658 Other specified problems related to psychosocial circumstances: Secondary | ICD-10-CM

## 2017-11-14 DIAGNOSIS — Z046 Encounter for general psychiatric examination, requested by authority: Secondary | ICD-10-CM | POA: Insufficient documentation

## 2017-11-14 DIAGNOSIS — Z79899 Other long term (current) drug therapy: Secondary | ICD-10-CM | POA: Insufficient documentation

## 2017-11-14 LAB — CBC
HCT: 27.3 % — ABNORMAL LOW (ref 36.0–46.0)
HCT: 30.8 % — ABNORMAL LOW (ref 36.0–46.0)
Hemoglobin: 8.5 g/dL — ABNORMAL LOW (ref 12.0–15.0)
Hemoglobin: 9.3 g/dL — ABNORMAL LOW (ref 12.0–15.0)
MCH: 24.6 pg — ABNORMAL LOW (ref 26.0–34.0)
MCH: 24.3 pg — ABNORMAL LOW (ref 26.0–34.0)
MCHC: 31.1 g/dL (ref 30.0–36.0)
MCHC: 30.2 g/dL (ref 30.0–36.0)
MCV: 78.9 fL (ref 78.0–100.0)
MCV: 80.4 fL (ref 78.0–100.0)
PLATELETS: 288 10*3/uL (ref 150–400)
Platelets: 295 10*3/uL (ref 150–400)
RBC: 3.46 MIL/uL — ABNORMAL LOW (ref 3.87–5.11)
RBC: 3.83 MIL/uL — ABNORMAL LOW (ref 3.87–5.11)
RDW: 16.5 % — AB (ref 11.5–15.5)
RDW: 17 % — ABNORMAL HIGH (ref 11.5–15.5)
WBC: 12.5 10*3/uL — AB (ref 4.0–10.5)
WBC: 9.2 10*3/uL (ref 4.0–10.5)

## 2017-11-14 LAB — COMPREHENSIVE METABOLIC PANEL
ALT: 32 U/L (ref 14–54)
AST: 22 U/L (ref 15–41)
Albumin: 3.1 g/dL — ABNORMAL LOW (ref 3.5–5.0)
Alkaline Phosphatase: 58 U/L (ref 38–126)
Anion gap: 10 (ref 5–15)
BUN: <5 mg/dL — ABNORMAL LOW (ref 6–20)
CO2: 29 mmol/L (ref 22–32)
Calcium: 9.2 mg/dL (ref 8.9–10.3)
Chloride: 100 mmol/L — ABNORMAL LOW (ref 101–111)
Creatinine, Ser: 0.61 mg/dL (ref 0.44–1.00)
GFR calc Af Amer: >60 mL/min (ref >60)
GFR calc non Af Amer: >60 mL/min (ref >60)
Glucose, Bld: 110 mg/dL — ABNORMAL HIGH (ref 65–99)
Potassium: 3.6 mmol/L (ref 3.5–5.1)
Sodium: 139 mmol/L (ref 135–145)
Total Bilirubin: 0.2 mg/dL — ABNORMAL LOW (ref 0.3–1.2)
Total Protein: 6 g/dL — ABNORMAL LOW (ref 6.5–8.1)

## 2017-11-14 LAB — ACETAMINOPHEN LEVEL: Acetaminophen (Tylenol), Serum: <10 ug/mL — ABNORMAL LOW (ref 10–30)

## 2017-11-14 LAB — GLUCOSE, CAPILLARY
GLUCOSE-CAPILLARY: 95 mg/dL (ref 65–99)
Glucose-Capillary: 130 mg/dL — ABNORMAL HIGH (ref 65–99)

## 2017-11-14 LAB — BASIC METABOLIC PANEL
Anion gap: 3 — ABNORMAL LOW (ref 5–15)
BUN: <5 mg/dL — ABNORMAL LOW (ref 6–20)
CALCIUM: 8 mg/dL — AB (ref 8.9–10.3)
CO2: 27 mmol/L (ref 22–32)
CREATININE: 0.47 mg/dL (ref 0.44–1.00)
Chloride: 112 mmol/L — ABNORMAL HIGH (ref 101–111)
GLUCOSE: 111 mg/dL — AB (ref 65–99)
Potassium: 3.4 mmol/L — ABNORMAL LOW (ref 3.5–5.1)
Sodium: 142 mmol/L (ref 135–145)

## 2017-11-14 LAB — SALICYLATE LEVEL: Salicylate Lvl: <7 mg/dL (ref 2.8–30.0)

## 2017-11-14 LAB — ETHANOL: Alcohol, Ethyl (B): <10 mg/dL (ref <10)

## 2017-11-14 MED ORDER — OXYCODONE HCL 10 MG PO TABS: 10.0000 mg | ORAL_TABLET | ORAL | 0 refills | Status: AC | PRN

## 2017-11-14 MED ORDER — OXYCODONE-ACETAMINOPHEN 5-325 MG PO TABS
1.0000 | ORAL_TABLET | Freq: Once | ORAL | Status: AC
  Administered 2017-11-14: 1 via ORAL
  Filled 2017-11-14: qty 1

## 2017-11-14 MED ORDER — POTASSIUM CHLORIDE CRYS ER 20 MEQ PO TBCR
60.0000 meq | EXTENDED_RELEASE_TABLET | Freq: Once | ORAL | Status: AC
  Administered 2017-11-14: 60 meq via ORAL
  Filled 2017-11-14: qty 3

## 2017-11-14 MED ORDER — DOXYCYCLINE HYCLATE 100 MG PO TABS: 100.0000 mg | ORAL_TABLET | Freq: Two times a day (BID) | ORAL | 0 refills | Status: DC

## 2017-11-14 MED ORDER — DOXYCYCLINE HYCLATE 100 MG PO TABS
100.0000 mg | ORAL_TABLET | Freq: Two times a day (BID) | ORAL | Status: DC
  Administered 2017-11-14: 100 mg via ORAL
  Filled 2017-11-14: qty 1

## 2017-11-14 NOTE — ED Triage Notes (Addendum)
Pt was just discharged from the hospital on her right arm from cellulitis. Upon being discharged her parents had her IVC'd. Denies SI or HI. States she thinks her mom got her IVC'd bc when she was discharged from the hospital her mom did not want her to go back to her fiance/boyfriends? House due to hx of drug abuse together.

## 2017-11-14 NOTE — Discharge Summary (Signed)
Name: DELORIES MAURI MRN: [id number] DOB: December 16, 1995 22 y.o. PCP: Patient, No Pcp Per  Date of Admission: 11/11/2017  9:23 PM Date of Discharge: 11/14/2017 Attending Physician: Earl Lagos, MD  Discharge Diagnosis: 1. R elbow abscess 2. R arm cellulitis   Discharge Medications: Allergies as of 11/14/2017   No Known Allergies     Medication List    STOP taking these medications   traMADol 50 MG tablet Commonly known as:  ULTRAM     TAKE these medications   doxycycline 100 MG tablet Commonly known as:  VIBRA-TABS Take 1 tablet (100 mg total) by mouth every 12 (twelve) hours.   ibuprofen 800 MG tablet Commonly known as:  ADVIL,MOTRIN Take 800 mg by mouth 3 (three) times daily.   Oxycodone HCl 10 MG Tabs Commonly known as:  ROXICODONE Take 1 tablet (10 mg total) by mouth every 4 (four) hours as needed for up to 7 days for severe pain.       Disposition and follow-up:   Ms.Nahla LIAHNA BRICKNER was discharged from [facility] in Stable condition.  At the hospital follow up visit please address:  1.  Please assess for ongoing signs of infection. Please ensure compliance with antibiotic therapy (doxy x 7 days) and that patient has follow up scheduled with ortho.   2.  Labs / imaging needed at time of follow-up: None   3.  Pending labs/ test needing follow-up: None   Follow-up Appointments: Follow-up Information    Kathryne Hitch, MD. Schedule an appointment as soon as possible for a visit in 2 week(s).   Specialty:  Orthopedic Surgery Contact information: [address] [place] [address] [phone]        [facility]. Call.   Why:  Call to schedule appointment and to get information on documentation needed.  Contact information: [address] [place], [place] [address] [phone]          Hospital Course by problem list:  1. R elbow abscess 2. R arm cellulitis Patient presented with  R arm pain, swelling and erythema after I&D at Urgent care the day prior to admission. She had been discharged on PO Bactrim but states swelling and erythema worseedn. Ortho consulted for surgical debridement which was performed on 6/23 without complications. No deep infection noted during procedure. She received 3 days of IV vancomycin and switched to doxycycline. She is to complete a 7 day course and should follow up with ortho 2 weeks after discharge.  She was prescribed oxycodone 10 mg q4h PRN for pain (42 tablets total).    Discharge Vitals:   BP 116/72   Pulse (!) 101   Temp 98.4 F (36.9 C) (Oral)   Resp 16   Ht 5\' 3"  (1.6 m)   Wt 96 lb (43.5 kg)   LMP 10/08/2017 (Approximate)   SpO2 100%   Breastfeeding? Unknown   BMI 17.01 kg/m   Pertinent Labs, Studies, and Procedures:   CBC Latest Ref Rng & Units 11/14/2017 11/14/2017 11/13/2017  WBC 4.0 - 10.5 K/uL 9.2 12.5(H) 3.8(L)  Hemoglobin 12.0 - 15.0 g/dL 5.2(W) 4.1(L) 2.4(M)  Hematocrit 36.0 - 46.0 % 30.8(L) 27.3(L) 29.6(L)  Platelets 150 - 400 K/uL 295 288 264   BMP Latest Ref Rng & Units 11/14/2017 11/14/2017 11/13/2017  Glucose 65 - 99 mg/dL 010(U) 725(D) 664(Q)  BUN 6 - 20 mg/dL <0(H) <4(V) <4(Q)  Creatinine 0.44 - 1.00 mg/dL 5.95 6.38 7.56  Sodium 135 -  145 mmol/L 139 142 143  Potassium 3.5 - 5.1 mmol/L 3.6 3.4(L) 3.4(L)  Chloride 101 - 111 mmol/L 100(L) 112(H) 111  CO2 22 - 32 mmol/L 29 27 27   Calcium 8.9 - 10.3 mg/dL 9.2 8.0(L) 8.5(L)    Discharge Instructions: Discharge Instructions    Call MD for:  redness, tenderness, or signs of infection (pain, swelling, redness, odor or green/yellow discharge around incision site)   Complete by:  As directed    Call MD for:  severe uncontrolled pain   Complete by:  As directed    Call MD for:  temperature >100.4   Complete by:  As directed    Diet general   Complete by:  As directed    Discharge instructions   Complete by:  As directed    Ms. Sherlyn LickWilliams,   You were admitted  to the hospital to an abscess in your elbow that needed to be drained surgically. You will need to continue taking antibiotics at home. We prescribed doxycycline 100 mg twice a day for 7 days. We also prescribed oxycodone 10 mg every 4 hours as needed for 7 days. Please make sure to follow up with the orthopedic surgeon in 2 weeks. Please call them if you do not receive a call form them to schedule an appointment. The case manager will help you make an appointment with a primary care provider as well. Please call us if you have any questions.   - Dr. Evelene CroonSantos   Increase activity slowly   Complete by:  As directed       Signed: Burna CashSantos-Sanchez, Cyrus Ramsburg, MD 11/15/2017, 1:58 PM   Pager: [phone]

## 2017-11-14 NOTE — Progress Notes (Signed)
RN gave pt discharge instructions pt stated understanding, Sitter wheeling her to her car. Pt has belongings as well as prescriptions

## 2017-11-14 NOTE — ED Notes (Signed)
IVC rescind paperwork faxed and received by Magistrate office; patient denies SI/HI and does not appear to pose a threat to self or others at this time-Monique,RN

## 2017-11-14 NOTE — ED Notes (Signed)
Patient belonging returned in condition received at triage; belongings was not inventoried due to quick turn around from triage to d/c and IVC rescinded-Monique,RN

## 2017-11-14 NOTE — Discharge Instructions (Signed)
Ice and elevation of your right arm and hand for swelling. You can get your current dressing wet in the shower. If you need to change that dressing, you can just place large band-aids over the incisions daily.

## 2017-11-14 NOTE — Progress Notes (Signed)
Pt states that she does not want her parents or anyone to know anything about her care, and doesn't want them to know that she is discharging.

## 2017-11-14 NOTE — Progress Notes (Signed)
Patient ID: Amanda Ballard, female   DOB: Jan 02, 1996, 22 y.o.   MRN: [id number] Tolerated surgery well yesterday.  Post-op pain to be expected.  Dressing changed at the bedside and incisions dry.  No redness.  Hand swelling due to being in a dependent position.  Can be discharged on oral antibiotics from ortho standpoint.

## 2017-11-14 NOTE — Care Management Note (Signed)
Case Management Note  Patient Details  Name: Amanda LazierHunter M Bergland MRN: [id number] Date of Birth: 28-Nov-1995  Subjective/Objective:   22 yr old young lady admitted with right elbow and forearm infection with cellulitis and abscess. Patient underwent I&D of right elbow and forearm abscess.                    Action/Plan: Patient will need a medical home at discharge. She is unisured. Patient lives in [place], [place]. CM provided patient with contact information for [facility] , [phone], [place], [place] [id number] [phone]. Patient informed that she must call to get information on documentation needed to bring in to schedule an appointment. This information added to discharge.    Expected Discharge Date:  11/14/17               Expected Discharge Plan:  Home/Self Care  In-House Referral:  NA  Discharge planning Services  CM Consult, [facility]igent [facility]  Post Acute Care Choice:  NA Choice offered to:  NA  DME Arranged:  N/A DME Agency:  NA  HH Arranged:  NA HH Agency:  NA  Status of Service:  Completed, signed off  If discussed at Long Length of Stay Meetings, dates discussed:    Additional Comments:  Durenda GuthrieBrady, Aila Terra Naomi, RN 11/14/2017, 2:18 PM

## 2017-11-14 NOTE — ED Notes (Signed)
Patient picked up by BF's mother-Monique,RN

## 2017-11-14 NOTE — ED Notes (Signed)
Pt denies HI or SI. 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  Pt admits to using drugs but not to harm herself.  States that she was clean but relapsed a month ago.

## 2017-11-14 NOTE — ED Provider Notes (Signed)
[facility] EMERGENCY DEPARTMENT Provider Note   CSN: [id number] Arrival date & time: 11/14/17  1621     History   Chief Complaint Chief Complaint  Patient presents with  . IVC    HPI Amanda Ballard is a 22 y.o. female.  HPI   22 year old female involuntarily committed by her father.  Petition states that patient has a history of drug abuse.  Also states that she threatened family members and has a past history of suicide attempt.  She denies any recent suicidal or homicidal ideation.  She denies any intent to harm her family or anybody else.  She states that she no longer uses drugs.  Past Medical History:  Diagnosis Date  . Anxiety   . Asthma   . GERD (gastroesophageal reflux disease)   . Headache   . Heroin abuse (HCC)   . Infection    UTI  . Medical history non-contributory   . Substance abuse (HCC)   . Vaginal Pap smear, abnormal     Patient Active Problem List   Diagnosis Date Noted  . Cellulitis of right arm 11/12/2017  . Opioid use disorder (HCC) 11/12/2017  . [redacted] weeks gestation of pregnancy   . Evaluate anatomy not seen on prior sonogram   . Encounter for fetal anatomic survey   . [redacted] weeks gestation of pregnancy   . [redacted] weeks gestation of pregnancy   . Abnormal fetal ultrasound   . Pregnancy complicated by fetal abdominal abnormality   . Gastroschisis, fetal, affecting care of mother, antepartum   . Encounter for routine screening for malformation using ultrasonics     Past Surgical History:  Procedure Laterality Date  . I&D EXTREMITY Right 11/13/2017   Procedure: IRRIGATION AND DEBRIDEMENT ARM;  Surgeon: Kathryne Hitch, MD;  Location: [facility] OR;  Service: Orthopedics;  Laterality: Right;  . TONSILLECTOMY       OB History    Gravida  2   Para  1   Term  1   Preterm      AB  1   Living  1     SAB      TAB      Ectopic      Multiple      Live Births               Home Medications    Prior to  Admission medications   Medication Sig Start Date End Date Taking? Authorizing Provider  doxycycline (VIBRA-TABS) 100 MG tablet Take 1 tablet (100 mg total) by mouth every 12 (twelve) hours. 11/14/17   Santos-Sanchez, Chelsea Primus, MD  ibuprofen (ADVIL,MOTRIN) 800 MG tablet Take 800 mg by mouth 3 (three) times daily. 06/17/17 11/20/17  [provider]  Oxycodone HCl 10 MG TABS Take 1 tablet (10 mg total) by mouth every 4 (four) hours as needed for up to 7 days for severe pain. 11/14/17 11/21/17  Burna Cash, MD    Family History No family history on file.  Social History Social History   Tobacco Use  . Smoking status: Current Every Day Smoker    Packs/day: 0.50    Years: 0.00    Pack years: 0.00    Types: Cigarettes  . Smokeless tobacco: Never Used  Substance Use Topics  . Alcohol use: No  . Drug use: Yes    Types: IV, Heroin, Benzodiazepines, Cocaine    Comment: last heroin use 2 weeks ago     Allergies   Patient has  no known allergies.   Review of Systems Review of Systems  All systems reviewed and negative, other than as noted in HPI.  Physical Exam Updated Vital Signs BP 112/74 (BP Location: Left Arm)   Pulse 96   Temp 98.2 F (36.8 C) (Oral)   Resp 16   LMP 10/14/2017   SpO2 100%   Physical Exam  Constitutional: She is oriented to person, place, and time. She appears well-developed and well-nourished. No distress.  HENT:  Head: Normocephalic and atraumatic.  Eyes: Conjunctivae are normal. Right eye exhibits no discharge. Left eye exhibits no discharge.  Neck: Neck supple.  Cardiovascular: Normal rate, regular rhythm and normal heart sounds. Exam reveals no gallop and no friction rub.  No murmur heard. Pulmonary/Chest: Effort normal and breath sounds normal. No respiratory distress.  Abdominal: Soft. She exhibits no distension. There is no tenderness.  Musculoskeletal:  R elbow with c/d bandages. Can actively range it. NVI.   Neurological: She is  alert and oriented to person, place, and time.  Skin: Skin is warm and dry.  Psychiatric: She has a normal mood and affect. Her behavior is normal. Thought content normal.  Nursing note and vitals reviewed.    ED Treatments / Results  Labs (all labs ordered are listed, but only abnormal results are displayed) Labs Reviewed  COMPREHENSIVE METABOLIC PANEL - Abnormal; Notable for the following components:      Result Value   Chloride 100 (*)    Glucose, Bld 110 (*)    BUN <5 (*)    Total Protein 6.0 (*)    Albumin 3.1 (*)    Total Bilirubin 0.2 (*)    All other components within normal limits  ACETAMINOPHEN LEVEL - Abnormal; Notable for the following components:   Acetaminophen (Tylenol), Serum <10 (*)    All other components within normal limits  CBC - Abnormal; Notable for the following components:   RBC 3.83 (*)    Hemoglobin 9.3 (*)    HCT 30.8 (*)    MCH 24.3 (*)    RDW 17.0 (*)    All other components within normal limits  ETHANOL  SALICYLATE LEVEL  RAPID URINE DRUG SCREEN, HOSP PERFORMED  I-STAT BETA HCG BLOOD, ED ([facility], [facility], [facility] ONLY)    EKG None  Radiology No results found.  Procedures Procedures (including critical care time)  Medications Ordered in ED Medications - No data to display   Initial Impression / Assessment and Plan / ED Course  I have reviewed the triage vital signs and the nursing notes.  Pertinent labs & imaging results that were available during my care of the patient were reviewed by me and considered in my medical decision making (see chart for details).     22 year old female involuntarily committed by her father.  Patient states that she has not actually spoken to her father since she recently left the hospital.  I do not have a basis to uphold this based on the petition or what she tells me. She denies SI/HI and she is not psychotic. She would like to leave. Her elbow appears to be healing fine at this point.   Final Clinical  Impressions(s) / ED Diagnoses   Final diagnoses:  Domestic problems    ED Discharge Orders    None       Raeford RazorKohut, Tejah Brekke, MD 11/14/17 [phone]

## 2017-11-14 NOTE — ED Notes (Signed)
ED Provider at bedside-Monique,RN  

## 2017-11-14 NOTE — Discharge Instructions (Addendum)
Follow-up with orthopedics as previously recommended.

## 2017-11-14 NOTE — Progress Notes (Signed)
   Subjective:  No acute events overnight. Pain well controlled. Complaining of itching at incision site and hand swelling. Discussed with patient she is stable for discharge and will go home with antibiotics and PO pain medicine x 7 days.   Objective:  Vital signs in last 24 hours: Vitals:   11/13/17 1007 11/13/17 1447 11/13/17 2115 11/14/17 0553  BP: 103/82 105/67 117/67 (!) 110/47  Pulse: 93 97 (!) 102 85  Resp: 14 16    Temp: (!) 97.5 F (36.4 C) 98 F (36.7 C) 98.1 F (36.7 C) 98.1 F (36.7 C)  TempSrc: Oral Oral Oral Oral  SpO2: 100% 100% 100% 99%  Weight:      Height:       Physical Exam  Constitutional: She is oriented to person, place, and time and well-developed, well-nourished, and in no distress.  Cardiovascular: Normal rate, regular rhythm and normal heart sounds. Exam reveals no gallop and no friction rub.  No murmur heard. Pulmonary/Chest: Effort normal and breath sounds normal. No respiratory distress. She has no wheezes. She has no rales.  Abdominal: Soft. Bowel sounds are normal. She exhibits no distension. There is no tenderness.  Musculoskeletal: She exhibits edema (R hand and R elbow swelling ).  Neurological: She is alert and oriented to person, place, and time.  Skin: There is erythema (R hand mildly erythematous though much improved ).  Incision is healing well without signs of infection     Assessment/Plan:  Principal Problem:   Cellulitis of right arm Active Problems:   Opioid use disorder (HCC)  # R elbow abscess associated with cellulitis:s/p POD #2 surgical debridement. Doing overall well. Incision healing well without signs of infection and ROM improved at R elbow. Bcx NGTD. She is medically stable for discharge. Will transition from vancomycin to PO doxycycline and provide 7 days of oxycodone for post-surgical pain. Case management assisting patient to establish with a PCP, appreciate assistance.   # Opioid use disorder: - Hydroxyzine 50  mg TID PRN for anxiety  - Dilaudid and oxycodone for pain. Will transition to PO oxy.   Dispo: Anticipated discharge today.   Burna CashSantos-Sanchez, Bart Ashford, MD 11/14/2017, 9:53 AM Pager: [phone]

## 2017-11-17 LAB — CULTURE, BLOOD (ROUTINE X 2)
CULTURE: NO GROWTH
CULTURE: NO GROWTH

## 2020-06-01 ENCOUNTER — Other Ambulatory Visit: Payer: Self-pay

## 2020-06-01 ENCOUNTER — Encounter (HOSPITAL_COMMUNITY): Payer: Self-pay

## 2020-06-01 ENCOUNTER — Emergency Department (HOSPITAL_COMMUNITY)
Admission: EM | Admit: 2020-06-01 | Discharge: 2020-06-01 | Disposition: A | Discharge status: Home / Self Care | Payer: Medicaid Other | Attending: Emergency Medicine | Admitting: Emergency Medicine

## 2020-06-01 DIAGNOSIS — M71052 Abscess of bursa, left hip: Secondary | ICD-10-CM | POA: Insufficient documentation

## 2020-06-01 DIAGNOSIS — Z5321 Procedure and treatment not carried out due to patient leaving prior to being seen by health care provider: Secondary | ICD-10-CM | POA: Insufficient documentation

## 2020-06-01 NOTE — ED Notes (Signed)
Screener stated pt is outside sitting in her car

## 2020-06-01 NOTE — ED Triage Notes (Signed)
Patient c/o left hip abscess x 2 weeks. Patient states she has had several over the past few weeks.

## 2020-06-01 NOTE — ED Notes (Signed)
Pt called 3x for room placement. Eloped from waiting area.  

## 2020-06-01 NOTE — ED Notes (Signed)
Pt called for room, no response from lobby 

## 2021-01-09 ENCOUNTER — Emergency Department (HOSPITAL_COMMUNITY): Payer: Medicaid Other

## 2021-01-09 ENCOUNTER — Encounter (HOSPITAL_COMMUNITY): Payer: Self-pay | Admitting: Emergency Medicine

## 2021-01-09 ENCOUNTER — Other Ambulatory Visit: Payer: Self-pay

## 2021-01-09 ENCOUNTER — Inpatient Hospital Stay (HOSPITAL_COMMUNITY)
Admission: EM | Admit: 2021-01-09 | Discharge: 2021-01-22 | DRG: 917 | Disposition: E | Payer: Medicaid Other | Attending: Pulmonary Disease | Admitting: Pulmonary Disease

## 2021-01-09 DIAGNOSIS — I469 Cardiac arrest, cause unspecified: Secondary | ICD-10-CM | POA: Diagnosis present

## 2021-01-09 DIAGNOSIS — D72829 Elevated white blood cell count, unspecified: Secondary | ICD-10-CM | POA: Diagnosis present

## 2021-01-09 DIAGNOSIS — E876 Hypokalemia: Secondary | ICD-10-CM | POA: Diagnosis present

## 2021-01-09 DIAGNOSIS — G935 Compression of brain: Secondary | ICD-10-CM | POA: Diagnosis present

## 2021-01-09 DIAGNOSIS — J9601 Acute respiratory failure with hypoxia: Secondary | ICD-10-CM | POA: Diagnosis present

## 2021-01-09 DIAGNOSIS — G931 Anoxic brain damage, not elsewhere classified: Secondary | ICD-10-CM | POA: Diagnosis present

## 2021-01-09 DIAGNOSIS — Z20822 Contact with and (suspected) exposure to covid-19: Secondary | ICD-10-CM | POA: Diagnosis present

## 2021-01-09 DIAGNOSIS — F17210 Nicotine dependence, cigarettes, uncomplicated: Secondary | ICD-10-CM | POA: Diagnosis present

## 2021-01-09 DIAGNOSIS — R739 Hyperglycemia, unspecified: Secondary | ICD-10-CM | POA: Diagnosis not present

## 2021-01-09 DIAGNOSIS — E872 Acidosis: Secondary | ICD-10-CM | POA: Diagnosis present

## 2021-01-09 DIAGNOSIS — I959 Hypotension, unspecified: Secondary | ICD-10-CM | POA: Diagnosis not present

## 2021-01-09 DIAGNOSIS — T43621A Poisoning by amphetamines, accidental (unintentional), initial encounter: Secondary | ICD-10-CM | POA: Diagnosis present

## 2021-01-09 DIAGNOSIS — T401X1A Poisoning by heroin, accidental (unintentional), initial encounter: Secondary | ICD-10-CM | POA: Diagnosis present

## 2021-01-09 DIAGNOSIS — Z66 Do not resuscitate: Secondary | ICD-10-CM | POA: Diagnosis not present

## 2021-01-09 DIAGNOSIS — Z515 Encounter for palliative care: Secondary | ICD-10-CM

## 2021-01-09 DIAGNOSIS — G936 Cerebral edema: Secondary | ICD-10-CM | POA: Diagnosis present

## 2021-01-09 DIAGNOSIS — R109 Unspecified abdominal pain: Secondary | ICD-10-CM

## 2021-01-09 DIAGNOSIS — Z4659 Encounter for fitting and adjustment of other gastrointestinal appliance and device: Secondary | ICD-10-CM

## 2021-01-09 DIAGNOSIS — K219 Gastro-esophageal reflux disease without esophagitis: Secondary | ICD-10-CM | POA: Diagnosis present

## 2021-01-09 DIAGNOSIS — Z5289 Donor of other specified organs or tissues: Secondary | ICD-10-CM

## 2021-01-09 LAB — URINALYSIS, ROUTINE W REFLEX MICROSCOPIC
Bilirubin Urine: NEGATIVE
Glucose, UA: >=500 mg/dL — AB
Ketones, ur: NEGATIVE mg/dL
Nitrite: POSITIVE — AB
Protein, ur: 100 mg/dL — AB
Specific Gravity, Urine: 1.01 (ref 1.005–1.030)
pH: 7 (ref 5.0–8.0)

## 2021-01-09 LAB — I-STAT VENOUS BLOOD GAS, ED
Acid-base deficit: 13 mmol/L — ABNORMAL HIGH (ref 0.0–2.0)
Bicarbonate: 14.5 mmol/L — ABNORMAL LOW (ref 20.0–28.0)
Calcium, Ion: 1.04 mmol/L — ABNORMAL LOW (ref 1.15–1.40)
HCT: 38 % (ref 36.0–46.0)
Hemoglobin: 12.9 g/dL (ref 12.0–15.0)
O2 Saturation: 99 %
Potassium: 3.5 mmol/L (ref 3.5–5.1)
Sodium: 136 mmol/L (ref 135–145)
TCO2: 16 mmol/L — ABNORMAL LOW (ref 22–32)
pCO2, Ven: 39.6 mmHg — ABNORMAL LOW (ref 44.0–60.0)
pH, Ven: 7.172 — CL (ref 7.250–7.430)
pO2, Ven: 156 mmHg — ABNORMAL HIGH (ref 32.0–45.0)

## 2021-01-09 LAB — CBC WITH DIFFERENTIAL/PLATELET
Abs Immature Granulocytes: 1.32 10*3/uL — ABNORMAL HIGH (ref 0.00–0.07)
Basophils Absolute: 0.1 10*3/uL (ref 0.0–0.1)
Basophils Relative: 1 %
Eosinophils Absolute: 0.1 10*3/uL (ref 0.0–0.5)
Eosinophils Relative: 1 %
HCT: 41.4 % (ref 36.0–46.0)
Hemoglobin: 12.8 g/dL (ref 12.0–15.0)
Immature Granulocytes: 7 %
Lymphocytes Relative: 46 %
Lymphs Abs: 9.7 10*3/uL — ABNORMAL HIGH (ref 0.7–4.0)
MCH: 29 pg (ref 26.0–34.0)
MCHC: 30.9 g/dL (ref 30.0–36.0)
MCV: 93.9 fL (ref 80.0–100.0)
Monocytes Absolute: 0.9 10*3/uL (ref 0.1–1.0)
Monocytes Relative: 5 %
Neutro Abs: 8.1 10*3/uL — ABNORMAL HIGH (ref 1.7–7.7)
Neutrophils Relative %: 40 %
Platelets: 315 10*3/uL (ref 150–400)
RBC: 4.41 MIL/uL (ref 3.87–5.11)
RDW: 12.1 % (ref 11.5–15.5)
WBC: 20.3 10*3/uL — ABNORMAL HIGH (ref 4.0–10.5)
nRBC: 0 % (ref 0.0–0.2)

## 2021-01-09 LAB — I-STAT ARTERIAL BLOOD GAS, ED
Acid-base deficit: 8 mmol/L — ABNORMAL HIGH (ref 0.0–2.0)
Bicarbonate: 20 mmol/L (ref 20.0–28.0)
Calcium, Ion: 1.15 mmol/L (ref 1.15–1.40)
HCT: 40 % (ref 36.0–46.0)
Hemoglobin: 13.6 g/dL (ref 12.0–15.0)
O2 Saturation: 100 %
Patient temperature: 93.4
Potassium: 4.7 mmol/L (ref 3.5–5.1)
Sodium: 139 mmol/L (ref 135–145)
TCO2: 21 mmol/L — ABNORMAL LOW (ref 22–32)
pCO2 arterial: 44.9 mmHg (ref 32.0–48.0)
pH, Arterial: 7.24 — ABNORMAL LOW (ref 7.350–7.450)
pO2, Arterial: 212 mmHg — ABNORMAL HIGH (ref 83.0–108.0)

## 2021-01-09 LAB — RAPID URINE DRUG SCREEN, HOSP PERFORMED
Amphetamines: POSITIVE — AB
Barbiturates: NOT DETECTED
Benzodiazepines: POSITIVE — AB
Cocaine: NOT DETECTED
Opiates: NOT DETECTED
Tetrahydrocannabinol: NOT DETECTED

## 2021-01-09 LAB — COMPREHENSIVE METABOLIC PANEL
ALT: 163 U/L — ABNORMAL HIGH (ref 0–44)
AST: 197 U/L — ABNORMAL HIGH (ref 15–41)
Albumin: 3.2 g/dL — ABNORMAL LOW (ref 3.5–5.0)
Alkaline Phosphatase: 62 U/L (ref 38–126)
Anion gap: 22 — ABNORMAL HIGH (ref 5–15)
BUN: 19 mg/dL (ref 6–20)
CO2: 13 mmol/L — ABNORMAL LOW (ref 22–32)
Calcium: 8.6 mg/dL — ABNORMAL LOW (ref 8.9–10.3)
Chloride: 102 mmol/L (ref 98–111)
Creatinine, Ser: 1.45 mg/dL — ABNORMAL HIGH (ref 0.44–1.00)
GFR, Estimated: 51 mL/min — ABNORMAL LOW (ref >60)
Glucose, Bld: 386 mg/dL — ABNORMAL HIGH (ref 70–99)
Potassium: 3.5 mmol/L (ref 3.5–5.1)
Sodium: 137 mmol/L (ref 135–145)
Total Bilirubin: 0.5 mg/dL (ref 0.3–1.2)
Total Protein: 5.8 g/dL — ABNORMAL LOW (ref 6.5–8.1)

## 2021-01-09 LAB — LIPASE, BLOOD: Lipase: 29 U/L (ref 11–51)

## 2021-01-09 LAB — CBG MONITORING, ED: Glucose-Capillary: 346 mg/dL — ABNORMAL HIGH (ref 70–99)

## 2021-01-09 LAB — RESP PANEL BY RT-PCR (FLU A&B, COVID) ARPGX2
Influenza A by PCR: NEGATIVE
Influenza B by PCR: NEGATIVE
SARS Coronavirus 2 by RT PCR: NEGATIVE

## 2021-01-09 LAB — I-STAT CHEM 8, ED
BUN: 18 mg/dL (ref 6–20)
Calcium, Ion: 1.04 mmol/L — ABNORMAL LOW (ref 1.15–1.40)
Chloride: 102 mmol/L (ref 98–111)
Creatinine, Ser: 1 mg/dL (ref 0.44–1.00)
Glucose, Bld: 379 mg/dL — ABNORMAL HIGH (ref 70–99)
HCT: 37 % (ref 36.0–46.0)
Hemoglobin: 12.6 g/dL (ref 12.0–15.0)
Potassium: 3.4 mmol/L — ABNORMAL LOW (ref 3.5–5.1)
Sodium: 135 mmol/L (ref 135–145)
TCO2: 17 mmol/L — ABNORMAL LOW (ref 22–32)

## 2021-01-09 LAB — GLUCOSE, CAPILLARY: Glucose-Capillary: 125 mg/dL — ABNORMAL HIGH (ref 70–99)

## 2021-01-09 LAB — LACTIC ACID, PLASMA: Lactic Acid, Venous: >11 mmol/L (ref 0.5–1.9)

## 2021-01-09 MED ORDER — SODIUM CHLORIDE 0.9 % IV SOLN
250.0000 mL | INTRAVENOUS | Status: DC
  Administered 2021-01-10: 250 mL via INTRAVENOUS

## 2021-01-09 MED ORDER — BUSPIRONE HCL 15 MG PO TABS: 30.0000 mg | ORAL_TABLET | Freq: Three times a day (TID) | ORAL | Status: DC

## 2021-01-09 MED ORDER — ACETAMINOPHEN 160 MG/5ML PO SOLN
650.0000 mg | ORAL | Status: DC
  Administered 2021-01-10 (×4): 650 mg
  Filled 2021-01-09 (×4): qty 20.3

## 2021-01-09 MED ORDER — POLYETHYLENE GLYCOL 3350 17 G PO PACK: 17.0000 g | PACK | Freq: Every day | ORAL | Status: DC | PRN

## 2021-01-09 MED ORDER — ACETAMINOPHEN 650 MG RE SUPP: 650.0000 mg | RECTAL | Status: DC

## 2021-01-09 MED ORDER — DOCUSATE SODIUM 100 MG PO CAPS: 100.0000 mg | ORAL_CAPSULE | Freq: Two times a day (BID) | ORAL | Status: DC | PRN

## 2021-01-09 MED ORDER — PROPOFOL 1000 MG/100ML IV EMUL
INTRAVENOUS | Status: AC
  Filled 2021-01-09: qty 100

## 2021-01-09 MED ORDER — LACTATED RINGERS IV BOLUS
1000.0000 mL | Freq: Once | INTRAVENOUS | Status: AC
  Administered 2021-01-09: 1000 mL via INTRAVENOUS

## 2021-01-09 MED ORDER — ACETAMINOPHEN 325 MG PO TABS: 650.0000 mg | ORAL_TABLET | ORAL | Status: DC

## 2021-01-09 MED ORDER — ENOXAPARIN SODIUM 30 MG/0.3ML IJ SOSY
30.0000 mg | PREFILLED_SYRINGE | Freq: Every day | INTRAMUSCULAR | Status: DC
  Administered 2021-01-10: 30 mg via SUBCUTANEOUS
  Filled 2021-01-09: qty 0.3

## 2021-01-09 MED ORDER — PHENYLEPHRINE HCL-NACL 20-0.9 MG/250ML-% IV SOLN
25.0000 ug/min | INTRAVENOUS | Status: DC
  Administered 2021-01-10: 175 ug/min via INTRAVENOUS
  Administered 2021-01-10: 25 ug/min via INTRAVENOUS
  Administered 2021-01-10: 115 ug/min via INTRAVENOUS
  Administered 2021-01-10: 125 ug/min via INTRAVENOUS
  Administered 2021-01-10: 75 ug/min via INTRAVENOUS
  Administered 2021-01-10: 165 ug/min via INTRAVENOUS
  Filled 2021-01-09 (×7): qty 250

## 2021-01-09 MED ORDER — BUSPIRONE HCL 15 MG PO TABS
30.0000 mg | ORAL_TABLET | Freq: Three times a day (TID) | ORAL | Status: DC
  Administered 2021-01-10: 30 mg
  Filled 2021-01-09: qty 2

## 2021-01-09 NOTE — ED Triage Notes (Signed)
Pt BIB [facility], pt known heroin user, recently out of jail. Per friends, pt sat down to have dinner and went unresponsive. Bystanders immediately gave 4mg  narcan, and started CPR. PEA on EMS arrival, given 1 epi and an additional 2mg  narcan IV with ROSC. Total 10 minutes of CPR. Pupils 16mm non-reactive\.

## 2021-01-09 NOTE — ED Provider Notes (Signed)
[facility] EMERGENCY DEPARTMENT Provider Note   CSN: [id number] Arrival date & time: 01/19/2021  2144     History Chief Complaint  Patient presents with   Cardiac Arrest    Amanda Ballard is a 25 y.o. female.  Patient is a 25 year old female with a past medical history of polysubstance abuse, asthma that is presenting as a postcardiac arrest.  Patient has a known history of IV heroin.  She was at a friend's house when she went unresponsive and unconscious.  It was reported that patient used IV heroin today by EMS.  Bystanders immediately gave 4 mg of Narcan.  CPR was started.  When EMS arrived patient had pulseless electrical activity.  She was given 1 epi and initial 2 mg of Narcan IV with ROSC.  She had a total of 10 minutes of CPR.  Patient was recently discharged from jail.   Cardiac Arrest     Past Medical History:  Diagnosis Date   Anxiety    Asthma    GERD (gastroesophageal reflux disease)    Headache    Heroin abuse (HCC)    Infection    UTI   Medical history non-contributory    Substance abuse (HCC)    Vaginal Pap smear, abnormal     Patient Active Problem List   Diagnosis Date Noted   Cardiac arrest (HCC) 01/04/2021   Cellulitis of right arm 11/12/2017   Opioid use disorder 11/12/2017   [redacted] weeks gestation of pregnancy    Evaluate anatomy not seen on prior sonogram    Encounter for fetal anatomic survey    [redacted] weeks gestation of pregnancy    [redacted] weeks gestation of pregnancy    Abnormal fetal ultrasound    Pregnancy complicated by fetal abdominal abnormality    Gastroschisis, fetal, affecting care of mother, antepartum    Encounter for routine screening for malformation using ultrasonics     Past Surgical History:  Procedure Laterality Date   I & D EXTREMITY Right 11/13/2017   Procedure: IRRIGATION AND DEBRIDEMENT ARM;  Surgeon: Kathryne Hitch, MD;  Location: [facility] OR;  Service: Orthopedics;  Laterality: Right;   TONSILLECTOMY        OB History     Gravida  2   Para  1   Term  1   Preterm      AB  1   Living  1      SAB      IAB      Ectopic      Multiple      Live Births              Family History  Problem Relation Age of Onset   Healthy Mother    Healthy Father     Social History   Tobacco Use   Smoking status: Every Day    Packs/day: 0.50    Years: 0.00    Pack years: 0.00    Types: Cigarettes   Smokeless tobacco: Never  Vaping Use   Vaping Use: Former  Substance Use Topics   Alcohol use: No   Drug use: Not Currently    Types: IV, Heroin, Benzodiazepines    Comment: last heroin use 2 weeks ago    Home Medications Prior to Admission medications   Medication Sig Start Date End Date Taking? Authorizing Provider  doxycycline (VIBRA-TABS) 100 MG tablet Take 1 tablet (100 mg total) by mouth every 12 (twelve) hours. 11/14/17   Santos-Sanchez, Chelsea Primus,  MD    Allergies    Patient has no known allergies.  Review of Systems   Review of Systems  Unable to perform ROS: Intubated   Physical Exam Updated Vital Signs BP 94/66   Pulse (!) 121   Temp (!) 93.3 F (34.1 C)   Resp 15   Ht 5\' 3"  (1.6 m)   SpO2 100%   BMI 16.83 kg/m   Physical Exam Constitutional:      General: She is in acute distress.     Appearance: She is ill-appearing and toxic-appearing.  HENT:     Head: Normocephalic and atraumatic.     Right Ear: External ear normal.     Left Ear: External ear normal.     Nose: Nose normal.     Mouth/Throat:     Mouth: Mucous membranes are moist.  Eyes:     Comments: Pupils were 4 mm and nonreactive  Cardiovascular:     Rate and Rhythm: Regular rhythm. Tachycardia present.     Pulses: Normal pulses.     Heart sounds: Normal heart sounds.  Pulmonary:     Comments: Mechanical breath sounds Abdominal:     General: Abdomen is flat.     Palpations: Abdomen is soft.  Musculoskeletal:     Right lower leg: No edema.     Left lower leg: No edema.  Skin:     General: Skin is warm and dry.  Neurological:     Comments: Sedated, no movement or reflexes since being in ED.    ED Results / Procedures / Treatments   Labs (all labs ordered are listed, but only abnormal results are displayed) Labs Reviewed  CBC WITH DIFFERENTIAL/PLATELET - Abnormal; Notable for the following components:      Result Value   WBC 20.3 (*)    Neutro Abs 8.1 (*)    Lymphs Abs 9.7 (*)    Abs Immature Granulocytes 1.32 (*)    All other components within normal limits  COMPREHENSIVE METABOLIC PANEL - Abnormal; Notable for the following components:   CO2 13 (*)    Glucose, Bld 386 (*)    Creatinine, Ser 1.45 (*)    Calcium 8.6 (*)    Total Protein 5.8 (*)    Albumin 3.2 (*)    AST 197 (*)    ALT 163 (*)    GFR, Estimated 51 (*)    Anion gap 22 (*)    All other components within normal limits  LACTIC ACID, PLASMA - Abnormal; Notable for the following components:   Lactic Acid, Venous >11.0 (*)    All other components within normal limits  URINALYSIS, ROUTINE W REFLEX MICROSCOPIC - Abnormal; Notable for the following components:   APPearance HAZY (*)    Glucose, UA >=500 (*)    Hgb urine dipstick MODERATE (*)    Protein, ur 100 (*)    Nitrite POSITIVE (*)    Leukocytes,Ua MODERATE (*)    Bacteria, UA MANY (*)    All other components within normal limits  RAPID URINE DRUG SCREEN, HOSP PERFORMED - Abnormal; Notable for the following components:   Benzodiazepines POSITIVE (*)    Amphetamines POSITIVE (*)    All other components within normal limits  GLUCOSE, CAPILLARY - Abnormal; Notable for the following components:   Glucose-Capillary 125 (*)    All other components within normal limits  I-STAT CHEM 8, ED - Abnormal; Notable for the following components:   Potassium 3.4 (*)    Glucose, Bld  379 (*)    Calcium, Ion 1.04 (*)    TCO2 17 (*)    All other components within normal limits  I-STAT VENOUS BLOOD GAS, ED - Abnormal; Notable for the following  components:   pH, Ven 7.172 (*)    pCO2, Ven 39.6 (*)    pO2, Ven 156.0 (*)    Bicarbonate 14.5 (*)    TCO2 16 (*)    Acid-base deficit 13.0 (*)    Calcium, Ion 1.04 (*)    All other components within normal limits  CBG MONITORING, ED - Abnormal; Notable for the following components:   Glucose-Capillary 346 (*)    All other components within normal limits  I-STAT ARTERIAL BLOOD GAS, ED - Abnormal; Notable for the following components:   pH, Arterial 7.240 (*)    pO2, Arterial 212 (*)    TCO2 21 (*)    Acid-base deficit 8.0 (*)    All other components within normal limits  RESP PANEL BY RT-PCR (FLU A&B, COVID) ARPGX2  MRSA NEXT GEN BY PCR, NASAL  LIPASE, BLOOD  LACTIC ACID, PLASMA  HIV ANTIBODY (ROUTINE TESTING W REFLEX)  CBC  CREATININE, SERUM  MAGNESIUM    EKG None  Radiology CT HEAD WO CONTRAST ( )  Result Date: 12/27/2020 CLINICAL DATA:  Unresponsive. EXAM: CT HEAD WITHOUT CONTRAST TECHNIQUE: Contiguous axial images were obtained from the base of the skull through the vertex without intravenous contrast. COMPARISON:  None. FINDINGS: Brain: Poor gray-white matter differentiation of the basal ganglia. No evidence of large-territorial acute infarction. No parenchymal hemorrhage. No mass lesion. No extra-axial collection. Loss of cervical sulci and cerebellar fissures. No hydrocephalus. Basilar cisterns are completely effaced. Downward transtentorial herniation of brainstem and cerebellar tonsillar herniation. Vascular: No hyperdense vessel. Skull: No acute fracture or focal lesion. Sinuses/Orbits: Paranasal sinuses and mastoid air cells are clear. The orbits are unremarkable. Other: None. IMPRESSION: 1. Cerebral edema with descending transtentorial herniation of brainstem and cerebellar tonsillar herniation. 2. Poor gray-white matter differentiation of the basal ganglia. 3. 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 These results were called by telephone at the time  of interpretation on 12/29/2020 at 10:39 pm to provider [facility] , who verbally acknowledged these results. Electronically Signed   By: Tish Frederickson M.D.   On: 01/14/2021 22:40   DG Chest Portable 1 View  Result Date: 01/08/2021 CLINICAL DATA:  Status post intubation. EXAM: PORTABLE CHEST 1 VIEW COMPARISON:  Chest radiograph dated 03/09/2016. FINDINGS: Endotracheal tube with tip approximately 2 cm above the carina. Enteric tube extends below the diaphragm with tip beyond the inferior margin of the image. The lungs are clear. There is no pleural effusion pneumothorax. The cardiac silhouette is within normal limits. No acute osseous pathology. IMPRESSION: 1. No acute cardiopulmonary process. 2. Endotracheal tube above the carina. Electronically Signed   By: Elgie Collard M.D.   On: 01/12/2021 22:10    Procedures Procedure Name: Intubation Date/Time: 01/05/2021 9:55 PM Performed by: Lottie Dawson, MD Pre-anesthesia Checklist: Patient identified, Patient being monitored, Timeout performed, Emergency Drugs available and Suction available Oxygen Delivery Method: Ambu bag Preoxygenation: Pre-oxygenation with 100% oxygen Laryngoscope Size: Glidescope and 3 Grade View: Grade I Tube size: 7.5 mm Number of attempts: 1 Placement Confirmation: ETT inserted through vocal cords under direct vision, Positive ETCO2, Breath sounds checked- equal and bilateral and CO2 detector Secured at: 23 cm Tube secured with: ETT holder Comments: Patient arrived with a King airway in place that was replaced by an ET tube.  Intubation  performed with no complications      Medications Ordered in ED Medications  docusate sodium (COLACE) capsule 100 mg (has no administration in time range)  polyethylene glycol (MIRALAX / GLYCOLAX) packet 17 g (has no administration in time range)  enoxaparin (LOVENOX) injection 30 mg (has no administration in time range)  acetaminophen (TYLENOL) tablet 650 mg (has no  administration in time range)    Or  acetaminophen (TYLENOL) 160 MG/5ML solution 650 mg (has no administration in time range)    Or  acetaminophen (TYLENOL) suppository 650 mg (has no administration in time range)  busPIRone (BUSPAR) tablet 30 mg (has no administration in time range)    Or  busPIRone (BUSPAR) tablet 30 mg (has no administration in time range)  0.9 %  sodium chloride infusion (has no administration in time range)  phenylephrine (NEO-SYNEPHRINE) 20mg /NS 250mL premix infusion (has no administration in time range)  lactated ringers bolus 1,000 mL (1,000 mLs Intravenous New Bag/Given 12/28/2020 2337)    ED Course  I have reviewed the triage vital signs and the nursing notes.  Pertinent labs & imaging results that were available during my care of the patient were reviewed by me and considered in my medical decision making (see chart for details).    MDM Rules/Calculators/A&P                         Amanda Ballard is a 25 y.o. female with a past medical history of substance abuse that is presenting as postcardiac arrest.  Patient has a history of IV heroin use.  The patient was at dinner tonight when patient went unresponsive and unconscious.  EMS reports that patient used IV heroin today.  Bystanders gave 4 mg of Narcan and started CPR.  On EMS arrival patient had pulseless electrical activity and was given 1 round of epi, 2 mg of IV Narcan and obtained ROSC.  Patient had a total of 10 minutes of CPR.  On arrival to our ED patient had a King airway in place.  [facility] by an endotracheal tube.  Patient was intubated without complications.  Chest x-ray confirmed placement of ET tube.  Patient had no purposeful movement since being in the ED.  She has not moved any extremities and her pupils are 4 mm and nonreactive.  Patient has no corneal reflex, no gag reflex.  Initial labs on arrival patient had a potassium of 3.4, glucose of 379.  Her initial blood gas had a pH  of 7.172, CO2 of 39.6, O2 of 156.  CBC, CMP, lipase, lactate, UA, UDS ordered.  Critical care was consulted for evaluation.  Patient admitted to critical care service.  Patient was transferred to critical care service prior to labs resulting.  CT head showed cerebral edema with ascending transtentorial herniation of brainstem and cerebellar tonsillar herniation.  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  The plan for this patient was discussed with Dr. Stevie Kernykstra, who voiced agreement and who oversaw evaluation and treatment of this patient.    Final Clinical Impression(s) / ED Diagnoses Final diagnoses:  Cardiac arrest [facility](HCC)    Rx / DC Orders ED Discharge Orders     None        Lottie DawsonByouk, Mckinnley Cottier, MD 01/18/2021 [id number]    Milagros Lollykstra, Richard S, MD 01/15/2021 [id number]

## 2021-01-09 NOTE — Progress Notes (Signed)
eLink Physician-Brief Progress Note Patient Name: Amanda Ballard DOB: 1996/05/24 MRN: [id number]   Date of Service  12/28/2020  HPI/Events of Note  Called d/t hypotension. However, patient now with BP = 107/80 with MAP = 86 with IV bolus of isotonic fluid. Head CT Scan already reveals evidence of cerebral edema and herniation. Prognosis is extremely poor for survival. No CVL or CVP available.  eICU Interventions  Plan: Phenylephrine IV infusion via PIV. Titrate to MAP >= 65.      Intervention Category Major Interventions: Hypotension - evaluation and management  Dejon Jungman Eugene 01/09/2021, 11:50 PM

## 2021-01-09 NOTE — H&P (Signed)
NAME:  BRITTLEY REGNER, MRN:  [id number], DOB:  1996/02/03, LOS: 0 ADMISSION DATE:  12/30/2020, CONSULTATION DATE:  01/21/2021 REFERRING MD:  Dr Stevie Kern, CHIEF COMPLAINT:  cardiac arrest   History of Present Illness:  This is a 25 year old white female that presented to the emergency room from home.  The patient was with friends and became unconscious unresponsive.  CPR was initiated by the friends.  Patient was given 4 mg of Narcan by friends.  Patient was found in PEA cardiac arrest by EMS.  Total CPR time was approximately 10 minutes before ROSC was obtained.  On arrival emergency room the patient was orally intubated and the [facility] airway was removed.  No sedation was required for intubation.  Recent history: Patient was discharged from jail in the last day or so.  She is a known heroin abuser.    Pertinent  Medical History  Unknown  Significant Hospital Events: Including procedures, antibiotic start and stop dates in addition to other pertinent events       Objective   Blood pressure (!) 125/101, pulse (!) 132, temperature (!) 96.7 F (35.9 C), temperature source Temporal, resp. rate 17, height 5\' 3"  (1.6 m), SpO2 100 %, unknown if currently breastfeeding.    Vent Mode: PRVC FiO2 (%):  [50 %-100 %] 50 % Set Rate:  [16 bmp] 16 bmp Vt Set:  [420 mL] 420 mL PEEP:  [5 cmH20] 5 cmH20 Plateau Pressure:  [13 cmH20] 13 cmH20  No intake or output data in the 24 hours ending 12/22/2020 2245 There were no vitals filed for this visit.  Examination: General: No acute distress HENT: Atraumatic/normocephalic mucous membranes are moist. Lungs: Clear to auscultation bilaterally.  No wheezing rales or rhonchi noted. Cardiovascular: Regular rate no murmur rub or gallop appreciated Abdomen: Soft, nondistended, no rebound/rigidity/guarding though significantly limited by neurologic status.  Positive bowel sounds. Extremities: Distal pulse intact x4.  No significant edema or cyanosis.  There is a  single pustule on the dorsum of the left foot. Neuro: Unconscious unresponsive.  There is no gag reflex there is no cough reflex there is no corneal reflex no spontaneous respirations, no response to painful stimuli.  Pupils are 4 mm and extremely sluggish.   GU: Foley catheter intact.    Assessment & Plan:  Acute respiratory failure Status post cardiac arrest Anoxic brain injury Possible narcotic overdose based on history Cerebral edema with transtentorial herniation of the brainstem  Plan: Admit to the intensive care unit for further work-up. Patient's been started on euthermic temperature control. Patient has not had any sedation or analgesia would add some if she shows neurologic indication for it. Serial neurochecks every hours. Patient is mildly tachycardic and borderline normotensive therefore we will add a  LR bolus now. Monitor I/os. Protonix for GI prophylaxis. N.p.o.  The patient is already presenting with brainstem herniation and extremely poor neurologic findings.  Overall prognosis is dismal and will probably rapidly progress towards brain death.   Best Practice (right click and "Reselect all SmartList Selections" daily)   Diet/type: NPO DVT prophylaxis: LMWH GI prophylaxis: PPI Lines: N/A Foley:  Yes, and it is still needed Code Status:  full code   Labs   CBC: Recent Labs  Lab 01/05/2021 2200 12/27/2020 2201 01/05/2021 2241  WBC 20.3*  --   --   NEUTROABS 8.1*  --   --   HGB 12.8  12.6 12.9 13.6  HCT 41.4  37.0 38.0 40.0  MCV 93.9  --   --  PLT 315  --   --     Basic Metabolic Panel: Recent Labs  Lab 12/27/2020 2200 01/20/2021 2201 12/27/2020 2241  NA 135 136 139  K 3.4* 3.5 4.7  CL 102  --   --   GLUCOSE 379*  --   --   BUN 18  --   --   CREATININE 1.00  --   --    GFR: CrCl cannot be calculated (Unknown ideal weight.). Recent Labs  Lab 01/05/2021 2200  WBC 20.3*    Liver Function Tests: No results for input(s): AST, ALT, ALKPHOS,  BILITOT, PROT, ALBUMIN in the last 168 hours. No results for input(s): LIPASE, AMYLASE in the last 168 hours. No results for input(s): AMMONIA in the last 168 hours.  ABG    Component Value Date/Time   PHART 7.240 (L) 01/04/2021 2241   PCO2ART 44.9 01/18/2021 2241   PO2ART 212 (H) 01/02/2021 2241   HCO3 20.0 12/26/2020 2241   TCO2 21 (L) 01/15/2021 2241   ACIDBASEDEF 8.0 (H) 12/22/2020 2241   O2SAT 100.0 12/25/2020 2241     Coagulation Profile: No results for input(s): INR, PROTIME in the last 168 hours.  Cardiac Enzymes: No results for input(s): CKTOTAL, CKMB, CKMBINDEX, TROPONINI in the last 168 hours.  HbA1C: No results found for: HGBA1C  CBG: Recent Labs  Lab 01/12/2021 2202  GLUCAP 346*    Review of Systems:   Unable to obtain secondary to patient's neurologic condition.  Past Medical History:  She,  has a past medical history of Anxiety, Asthma, GERD (gastroesophageal reflux disease), Headache, Heroin abuse (HCC), Infection, Medical history non-contributory, Substance abuse (HCC), and Vaginal Pap smear, abnormal.   Surgical History:   Past Surgical History:  Procedure Laterality Date   I & D EXTREMITY Right 11/13/2017   Procedure: IRRIGATION AND DEBRIDEMENT ARM;  Surgeon: Kathryne Hitch, MD;  Location: [facility] OR;  Service: Orthopedics;  Laterality: Right;   TONSILLECTOMY       Social History:   reports that she has been smoking cigarettes. She has been smoking an average of 0.50 packs per day. She has never used smokeless tobacco. She reports that she does not currently use drugs after having used the following drugs: IV, Heroin, and Benzodiazepines. She reports that she does not drink alcohol.   Family History:  Her family history includes Healthy in her father and mother.   Allergies No Known Allergies   Home Medications  Prior to Admission medications   Medication Sig Start Date End Date Taking? Authorizing Provider  doxycycline (VIBRA-TABS) 100  MG tablet Take 1 tablet (100 mg total) by mouth every 12 (twelve) hours. 11/14/17   Burna Cash, MD     Critical care time: 55 mins

## 2021-01-10 ENCOUNTER — Encounter (HOSPITAL_COMMUNITY): Admission: EM | Disposition: E | Payer: Self-pay | Source: Home / Self Care | Attending: Pulmonary Disease

## 2021-01-10 ENCOUNTER — Inpatient Hospital Stay (HOSPITAL_COMMUNITY): Payer: Medicaid Other

## 2021-01-10 ENCOUNTER — Encounter (HOSPITAL_COMMUNITY): Payer: Self-pay | Admitting: Anesthesiology

## 2021-01-10 DIAGNOSIS — T40601A Poisoning by unspecified narcotics, accidental (unintentional), initial encounter: Secondary | ICD-10-CM

## 2021-01-10 DIAGNOSIS — G931 Anoxic brain damage, not elsewhere classified: Secondary | ICD-10-CM

## 2021-01-10 DIAGNOSIS — I469 Cardiac arrest, cause unspecified: Secondary | ICD-10-CM

## 2021-01-10 HISTORY — PX: ORGAN PROCUREMENT: SHX5270

## 2021-01-10 LAB — URINALYSIS, ROUTINE W REFLEX MICROSCOPIC
Bilirubin Urine: NEGATIVE
Glucose, UA: NEGATIVE mg/dL
Ketones, ur: NEGATIVE mg/dL
Nitrite: NEGATIVE
Protein, ur: NEGATIVE mg/dL
Specific Gravity, Urine: 1.003 — ABNORMAL LOW (ref 1.005–1.030)
pH: 7 (ref 5.0–8.0)

## 2021-01-10 LAB — CBC WITH DIFFERENTIAL/PLATELET
Abs Immature Granulocytes: 0.16 10*3/uL — ABNORMAL HIGH (ref 0.00–0.07)
Basophils Absolute: 0 10*3/uL (ref 0.0–0.1)
Basophils Relative: 0 %
Eosinophils Absolute: 0 10*3/uL (ref 0.0–0.5)
Eosinophils Relative: 0 %
HCT: 49.5 % — ABNORMAL HIGH (ref 36.0–46.0)
Hemoglobin: 15.9 g/dL — ABNORMAL HIGH (ref 12.0–15.0)
Immature Granulocytes: 1 %
Lymphocytes Relative: 13 %
Lymphs Abs: 2.1 10*3/uL (ref 0.7–4.0)
MCH: 29 pg (ref 26.0–34.0)
MCHC: 32.1 g/dL (ref 30.0–36.0)
MCV: 90.2 fL (ref 80.0–100.0)
Monocytes Absolute: 1 10*3/uL (ref 0.1–1.0)
Monocytes Relative: 6 %
Neutro Abs: 13.1 10*3/uL — ABNORMAL HIGH (ref 1.7–7.7)
Neutrophils Relative %: 80 %
Platelets: 439 10*3/uL — ABNORMAL HIGH (ref 150–400)
RBC: 5.49 MIL/uL — ABNORMAL HIGH (ref 3.87–5.11)
RDW: 12.5 % (ref 11.5–15.5)
WBC: 16.4 10*3/uL — ABNORMAL HIGH (ref 4.0–10.5)
nRBC: 0 % (ref 0.0–0.2)

## 2021-01-10 LAB — HIV ANTIBODY (ROUTINE TESTING W REFLEX): HIV Screen 4th Generation wRfx: NONREACTIVE

## 2021-01-10 LAB — BASIC METABOLIC PANEL
BUN: 11 mg/dL (ref 6–20)
CO2: 22 mmol/L (ref 22–32)
Calcium: 9.8 mg/dL (ref 8.9–10.3)
Chloride: >130 mmol/L (ref 98–111)
Creatinine, Ser: 1 mg/dL (ref 0.44–1.00)
GFR, Estimated: >60 mL/min (ref >60)
Glucose, Bld: 126 mg/dL — ABNORMAL HIGH (ref 70–99)
Potassium: 3.3 mmol/L — ABNORMAL LOW (ref 3.5–5.1)
Sodium: 167 mmol/L (ref 135–145)

## 2021-01-10 LAB — ABO/RH
ABO/RH(D): A NEG
PT AG Type: POSITIVE
Weak D: NEGATIVE

## 2021-01-10 LAB — GLUCOSE, CAPILLARY
Glucose-Capillary: 105 mg/dL — ABNORMAL HIGH (ref 70–99)
Glucose-Capillary: 97 mg/dL (ref 70–99)
Glucose-Capillary: 127 mg/dL — ABNORMAL HIGH (ref 70–99)
Glucose-Capillary: 155 mg/dL — ABNORMAL HIGH (ref 70–99)
Glucose-Capillary: 133 mg/dL — ABNORMAL HIGH (ref 70–99)
Glucose-Capillary: 115 mg/dL — ABNORMAL HIGH (ref 70–99)

## 2021-01-10 LAB — CREATININE, SERUM
Creatinine, Ser: 0.81 mg/dL (ref 0.44–1.00)
GFR, Estimated: >60 mL/min (ref >60)

## 2021-01-10 LAB — MAGNESIUM: Magnesium: 1.8 mg/dL (ref 1.7–2.4)

## 2021-01-10 LAB — MRSA NEXT GEN BY PCR, NASAL: MRSA by PCR Next Gen: NOT DETECTED

## 2021-01-10 LAB — PREPARE RBC (CROSSMATCH)

## 2021-01-10 LAB — PREGNANCY, URINE: Preg Test, Ur: NEGATIVE

## 2021-01-10 LAB — LACTIC ACID, PLASMA: Lactic Acid, Venous: 3 mmol/L (ref 0.5–1.9)

## 2021-01-10 IMAGING — DX DG CHEST 1V
1 series · 1 of 1 positions shown · non-contrast
Comparison: [DATE]

CLINICAL DATA: Donor lung transplant, intubated

EXAM:
CHEST  1 VIEW

[chest ap]
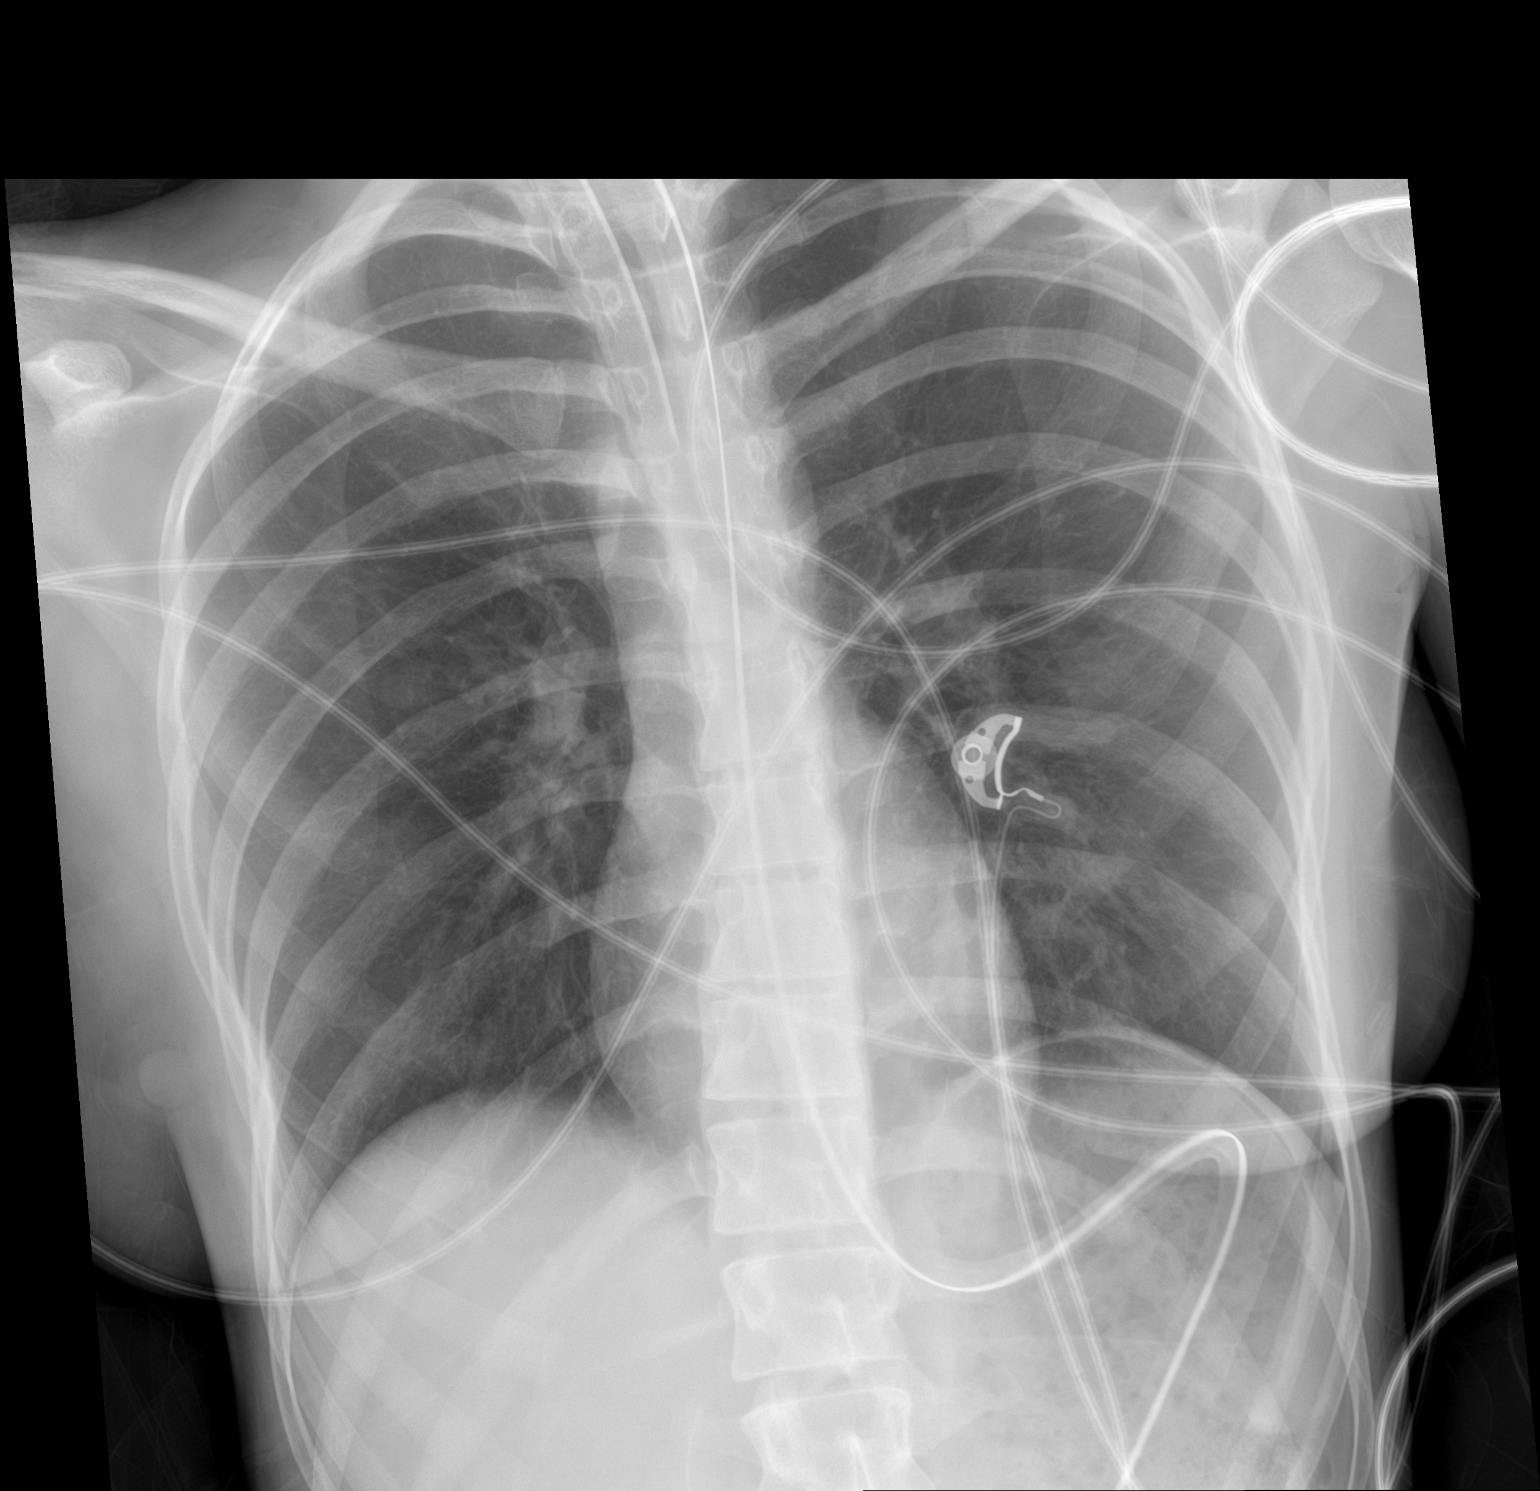

[1 of 1 positions shown; findings below may reference images not displayed]

FINDINGS: Single frontal view of the chest demonstrates endotracheal tube
overlying tracheal air column tip well above carina. Enteric
catheter passes below diaphragm, tip excluded by collimation. The
cardiac silhouette is unremarkable. Lungs are normally inflated
without airspace disease, effusion, or pneumothorax. No acute bony
abnormalities.
IMPRESSION: 1. No acute intrathoracic process.  Support devices as above.

## 2021-01-10 SURGERY — SURGICAL PROCUREMENT, ORGAN
Anesthesia: General

## 2021-01-10 MED ORDER — HEPARIN SODIUM (PORCINE) 1000 UNIT/ML IJ SOLN
INTRAMUSCULAR | Status: AC
  Filled 2021-01-10: qty 1

## 2021-01-10 MED ORDER — MAGNESIUM SULFATE 2 GM/50ML IV SOLN
2.0000 g | Freq: Once | INTRAVENOUS | Status: AC
  Administered 2021-01-10: 2 g via INTRAVENOUS
  Filled 2021-01-10: qty 50

## 2021-01-10 MED ORDER — PANTOPRAZOLE SODIUM 40 MG IV SOLR
40.0000 mg | INTRAVENOUS | Status: DC
  Administered 2021-01-10: 40 mg via INTRAVENOUS
  Filled 2021-01-10: qty 40

## 2021-01-10 MED ORDER — INSULIN ASPART 100 UNIT/ML IJ SOLN
INTRAMUSCULAR | Status: AC
  Filled 2021-01-10: qty 1

## 2021-01-10 MED ORDER — ORAL CARE MOUTH RINSE
15.0000 mL | OROMUCOSAL | Status: DC
  Administered 2021-01-10 (×7): 15 mL via OROMUCOSAL

## 2021-01-10 MED ORDER — NOREPINEPHRINE 4 MG/250ML-% IV SOLN
INTRAVENOUS | Status: AC
  Filled 2021-01-10: qty 250

## 2021-01-10 MED ORDER — INSULIN REGULAR HUMAN 100 UNIT/ML IJ SOLN
INTRAMUSCULAR | Status: DC | PRN
  Administered 2021-01-10: 10 [IU]

## 2021-01-10 MED ORDER — CHLORHEXIDINE GLUCONATE 0.12% ORAL RINSE (MEDLINE KIT)
15.0000 mL | Freq: Two times a day (BID) | OROMUCOSAL | Status: DC
  Administered 2021-01-10: 15 mL via OROMUCOSAL

## 2021-01-10 MED ORDER — GUAIFENESIN-DM 100-10 MG/5ML PO SYRP
5.0000 mL | ORAL_SOLUTION | ORAL | Status: DC | PRN
  Filled 2021-01-10: qty 5

## 2021-01-10 MED ORDER — VASOPRESSIN 20 UNITS/100 ML INFUSION FOR SHOCK
0.0000 [IU]/min | INTRAVENOUS | Status: DC
  Administered 2021-01-10: 0.01 [IU]/min via INTRAVENOUS
  Filled 2021-01-10: qty 100

## 2021-01-10 MED ORDER — ENOXAPARIN SODIUM 40 MG/0.4ML IJ SOSY: 40.0000 mg | PREFILLED_SYRINGE | Freq: Every day | INTRAMUSCULAR | Status: DC

## 2021-01-10 MED ORDER — 0.9 % SODIUM CHLORIDE (POUR BTL) OPTIME
TOPICAL | Status: DC | PRN
  Administered 2021-01-10: 5000 mL

## 2021-01-10 MED ORDER — SODIUM CHLORIDE 0.9% IV SOLUTION: Freq: Once | INTRAVENOUS | Status: DC

## 2021-01-10 MED ORDER — SODIUM CHLORIDE 0.9 % IV SOLN: INTRAVENOUS | Status: DC | PRN

## 2021-01-10 MED ORDER — NOREPINEPHRINE 4 MG/250ML-% IV SOLN
0.0000 ug/min | INTRAVENOUS | Status: DC
  Administered 2021-01-10: 2 ug/min via INTRAVENOUS

## 2021-01-10 MED ORDER — CHLORHEXIDINE GLUCONATE CLOTH 2 % EX PADS: 6.0000 | MEDICATED_PAD | Freq: Every day | CUTANEOUS | Status: DC

## 2021-01-10 MED ORDER — HEPARIN SODIUM (PORCINE) 10000 UNIT/ML IJ SOLN
INTRAMUSCULAR | Status: DC | PRN
  Administered 2021-01-10: 10000 [IU] via INTRAVENOUS

## 2021-01-10 SURGICAL SUPPLY — 92 items
BAG COUNTER SPONGE SURGICOUNT (BAG) ×2 IMPLANT
BAG SPNG CNTER NS LX DISP (BAG) ×1
BLADE CLIPPER SURG (BLADE) IMPLANT
BLADE SAW STERNAL (BLADE) ×2 IMPLANT
BLADE STERNUM SYSTEM 6 (BLADE) ×1 IMPLANT
BLADE SURG 10 STRL SS (BLADE) IMPLANT
CLIP VESOCCLUDE MED 24/CT (CLIP) IMPLANT
CLIP VESOCCLUDE SM WIDE 24/CT (CLIP) IMPLANT
CNTNR URN SCR LID CUP LEK RST (MISCELLANEOUS) ×1 IMPLANT
CONT SPEC 4OZ STRL OR WHT (MISCELLANEOUS) ×4
COVER BACK TABLE 60X90IN (DRAPES) IMPLANT
COVER MAYO STAND STRL (DRAPES) ×1 IMPLANT
COVER SURGICAL LIGHT HANDLE (MISCELLANEOUS) ×2 IMPLANT
DRAPE HALF SHEET 40X57 (DRAPES) IMPLANT
DRAPE SLUSH MACHINE 52X66 (DRAPES) ×2 IMPLANT
DRSG COVADERM 4X10 (GAUZE/BANDAGES/DRESSINGS) IMPLANT
DRSG COVADERM 4X14 (GAUZE/BANDAGES/DRESSINGS) ×2 IMPLANT
DRSG TELFA 3X8 NADH (GAUZE/BANDAGES/DRESSINGS) ×4 IMPLANT
DURAPREP 26ML APPLICATOR (WOUND CARE) IMPLANT
ELECT BLADE 4.0 EZ CLEAN MEGAD (MISCELLANEOUS) ×2
ELECT BLADE 6.5 EXT (BLADE) IMPLANT
ELECT REM PT RETURN 9FT ADLT (ELECTROSURGICAL) ×4
ELECTRODE BLDE 4.0 EZ CLN MEGD (MISCELLANEOUS) IMPLANT
ELECTRODE REM PT RTRN 9FT ADLT (ELECTROSURGICAL) ×2 IMPLANT
GAUZE 4X4 16PLY ~~LOC~~+RFID DBL (SPONGE) IMPLANT
GLOVE SRG 8 PF TXTR STRL LF DI (GLOVE) IMPLANT
GLOVE SURG ENC MOIS LTX SZ7 (GLOVE) IMPLANT
GLOVE SURG ENC MOIS LTX SZ7.5 (GLOVE) IMPLANT
GLOVE SURG ENC MOIS LTX SZ8 (GLOVE) IMPLANT
GLOVE SURG ENC MOIS LTX SZ8.5 (GLOVE) IMPLANT
GLOVE SURG POLYISO LF SZ7 (GLOVE) IMPLANT
GLOVE SURG POLYISO LF SZ7.5 (GLOVE) IMPLANT
GLOVE SURG POLYISO LF SZ8 (GLOVE) IMPLANT
GLOVE SURG UNDER POLY LF SZ7 (GLOVE) IMPLANT
GLOVE SURG UNDER POLY LF SZ7.5 (GLOVE) IMPLANT
GLOVE SURG UNDER POLY LF SZ8 (GLOVE)
GLOVE SURG UNDER POLY LF SZ8.5 (GLOVE) IMPLANT
GOWN STRL REUS W/ TWL LRG LVL3 (GOWN DISPOSABLE) ×4 IMPLANT
GOWN STRL REUS W/ TWL XL LVL3 (GOWN DISPOSABLE) ×2 IMPLANT
GOWN STRL REUS W/TWL LRG LVL3 (GOWN DISPOSABLE) ×8
GOWN STRL REUS W/TWL XL LVL3 (GOWN DISPOSABLE) ×4
HANDLE SUCTION POOLE (INSTRUMENTS) IMPLANT
KIT POST MORTEM ADULT 36X90 (BAG) ×2 IMPLANT
KIT TURNOVER KIT B (KITS) ×2 IMPLANT
LOOP VESSEL MAXI BLUE (MISCELLANEOUS) IMPLANT
LOOP VESSEL MINI RED (MISCELLANEOUS) IMPLANT
MANIFOLD NEPTUNE II (INSTRUMENTS) ×2 IMPLANT
MARKER SKIN DUAL TIP RULER LAB (MISCELLANEOUS) ×1 IMPLANT
NDL BIOPSY 14X6 SOFT TISS (NEEDLE) IMPLANT
NEEDLE BIOPSY 14X6 SOFT TISS (NEEDLE) IMPLANT
NS IRRIG 1000ML POUR BTL (IV SOLUTION) IMPLANT
PACK AORTA (CUSTOM PROCEDURE TRAY) ×2 IMPLANT
PAD ARMBOARD 7.5X6 YLW CONV (MISCELLANEOUS) ×4 IMPLANT
PAD DRESSING TELFA 3X8 NADH (GAUZE/BANDAGES/DRESSINGS) ×1 IMPLANT
PENCIL BUTTON HOLSTER BLD 10FT (ELECTRODE) ×3 IMPLANT
SOL PREP POV-IOD 4OZ 10% (MISCELLANEOUS) ×4 IMPLANT
SPONGE INTESTINAL PEANUT (DISPOSABLE) IMPLANT
SPONGE T-LAP 18X18 ~~LOC~~+RFID (SPONGE) IMPLANT
STAPLER VISISTAT 35W (STAPLE) ×3 IMPLANT
SUCTION POOLE HANDLE (INSTRUMENTS) ×6
SUT BONE WAX W31G (SUTURE) IMPLANT
SUT ETHIBOND 5 LR DA (SUTURE) IMPLANT
SUT ETHILON 1 LR 30 (SUTURE) ×5 IMPLANT
SUT ETHILON 2 LR (SUTURE) IMPLANT
SUT PROLENE 3 0 RB 1 (SUTURE) IMPLANT
SUT PROLENE 3 0 SH 1 (SUTURE) IMPLANT
SUT PROLENE 4 0 RB 1 (SUTURE) ×6
SUT PROLENE 4 0 SH DA (SUTURE) ×2 IMPLANT
SUT PROLENE 4-0 RB1 .5 CRCL 36 (SUTURE) IMPLANT
SUT PROLENE 4-0 RB1 18X2 ARM (SUTURE) IMPLANT
SUT PROLENE 5 0 C 1 24 (SUTURE) IMPLANT
SUT PROLENE 6 0 BV (SUTURE) IMPLANT
SUT SILK 0 TIES 10X30 (SUTURE) IMPLANT
SUT SILK 1 SH (SUTURE) IMPLANT
SUT SILK 1 TIES 10X30 (SUTURE) IMPLANT
SUT SILK 2 0 (SUTURE)
SUT SILK 2 0 SH (SUTURE) IMPLANT
SUT SILK 2 0 SH CR/8 (SUTURE) ×2 IMPLANT
SUT SILK 2 0 TIES 10X30 (SUTURE) IMPLANT
SUT SILK 2 0SH CR/8 30 (SUTURE) ×1 IMPLANT
SUT SILK 2-0 18XBRD TIE 12 (SUTURE) IMPLANT
SUT SILK 3 0 SH CR/8 (SUTURE) IMPLANT
SUT SILK 3 0 TIES 10X30 (SUTURE) IMPLANT
SWAB COLLECTION DEVICE MRSA (MISCELLANEOUS) IMPLANT
SWAB CULTURE ESWAB REG 1ML (MISCELLANEOUS) IMPLANT
SYR 50ML LL SCALE MARK (SYRINGE) IMPLANT
SYRINGE TOOMEY DISP (SYRINGE) IMPLANT
TAPE UMBILICAL 1/8 X36 TWILL (MISCELLANEOUS) ×2 IMPLANT
TUBE CONNECTING 12X1/4 (SUCTIONS) ×2 IMPLANT
TUBE CONNECTING 20X1/4 (TUBING) ×6 IMPLANT
WATER STERILE IRR 1000ML POUR (IV SOLUTION) IMPLANT
YANKAUER SUCT BULB TIP NO VENT (SUCTIONS) ×3 IMPLANT

## 2021-01-11 LAB — BPAM RBC
Blood Product Expiration Date: 202208242359
Blood Product Expiration Date: 202208242359
Blood Product Expiration Date: 202209142359
ISSUE DATE / TIME: 202208201941
ISSUE DATE / TIME: 202208201941
ISSUE DATE / TIME: 202208201941
Unit Type and Rh: 600
Unit Type and Rh: 600
Unit Type and Rh: 600

## 2021-01-11 LAB — TYPE AND SCREEN
ABO/RH(D): A NEG
Antibody Screen: NEGATIVE
PT AG Type: POSITIVE
Unit division: 0
Unit division: 0
Unit division: 0
Weak D: NEGATIVE

## 2021-01-11 NOTE — OR Nursing (Signed)
Time of Death - 2147 [place] time - 2153 Kidneys out - 2228 Lungs out - 2237  Dr. Betti Cruz - Lung Surgeon Dr. Everardo Beals - Abdominal Surgeon April Manson - Organ Procurement  Santa Lighter - ICU RN Esther Hardy - ICU RN Vernell Leep - ICU RN Colonel Bald - Organ Procurement Specialist Olegario Shearer - Respiratory Therapy Mhanglee Panchit - Surgical Tech Dion Body - Circulating Nurse

## 2021-01-11 NOTE — Progress Notes (Signed)
After receiving report, this nurse, Zada Finders RN and Malachi Bonds RN received the notification the patient was ready to be transported to the OR. Patient was then transported with this nurse, Malachi Bonds RN, Zada Finders RN, RT, honor bridge and family. Family said their last goodbye before enter OR room 12. Patient was prep and honor bridge told us to turn off medication and administer 13000 units of heparin. This nurse and Zada Finders. Listened for an heart beat after monitor reported asystole. Time of death was called at 09/23/45. Lungs and kidneys were harvested. Bed was returned to 43m13.

## 2021-01-12 ENCOUNTER — Encounter (HOSPITAL_COMMUNITY): Payer: Self-pay

## 2021-01-12 NOTE — Progress Notes (Signed)
  Amanda Ballard was a 25 y.o. female smoker with history of heroin abuse and was recently in jail was found unresponsive after eating dinner with her friends.  She was given narcan and CPR started.  Noted to have PEA cardiac arrest by EMS.  Had ROSC about 10 minutes after EMS arrival.  Had [facility] airway changed to ETT in ER.  She remained unresponsive without need for sedation and pupils fixed and dilated.  UDS positive for benzodiazepine and amphetamines.  CT head showed cerebral edema with descending transtentorial herniation of brainstem and cerebellar tonsillar herniation with poor gray-white matter differentiation of the basal ganglia consistent with hypoxic-ischemic encephalopathy.  Family opted for comfort measures.  She was candidate for organ donation.  She expired on 01/07/2021 at 21:47.  Final diagnoses: PEA cardiac arrest 2nd to opiate, benzodiazepine, and amphetamine overdose Acute anoxic encephalopathy Cytotoxic cerebral edema Descending transtentorial herniation of brainstem Cerebellar tonsillar herniation Acute hypoxic respiratory failure Compromised airway Metabolic acidosis with lactic acidosis Hyperglycemia likely from acute stress Hypokalemia Leukocytosis  [name], MD [facility] Pulmonary/Critical Care 01/12/2021, 1:24 PM

## 2021-01-13 LAB — URINE CULTURE: Culture: 20000 — AB

## 2021-01-14 LAB — CULTURE, RESPIRATORY W GRAM STAIN

## 2021-01-15 LAB — CULTURE, BLOOD (ROUTINE X 2)
Culture: NO GROWTH
Culture: NO GROWTH

## 2021-01-22 NOTE — Progress Notes (Signed)
PCCM Progress Note  Amanda Ballard is a 25 y.o. female smoker with history of heroin abuse and was recently in jail was found unresponsive after eating dinner with her friends.  She was given narcan and CPR started.  Noted to have PEA cardiac arrest by EMS.  Had ROSC about 10 minutes after EMS arrival.  Had [facility] airway changed to ETT in ER.  She remained unresponsive without need for sedation and pupils fixed and dilated.  UDS positive for benzodiazepine and amphetamines.  CT head showed cerebral edema with descending transtentorial herniation of brainstem and cerebellar tonsillar herniation with poor gray-white matter differentiation of the basal ganglia consistent with hypoxic-ischemic encephalopathy.  BP (!) 87/73   Pulse 97   Temp (!) 94.8 F (34.9 C) (Bladder)   Resp 16   Ht 5\' 3"  (1.6 m)   Wt 50.7 kg   SpO2 100%   BMI 19.80 kg/m    General - unresponsive, on ventilator, not initiating breaths on her own Eyes - pupils dilated, non reactive ENT - ETT in place, no gag reflex Cardiac - regular rate/rhythm, no murmur Chest - equal breath sounds b/l, no wheezing or rales Abdomen - soft, non tender, + bowel sounds Extremities - no cyanosis, clubbing, or edema Skin - no rashes  Assessment: PEA cardiac arrest 2nd to opiate, benzodiazepine, and amphetamine overdose Acute anoxic encephalopathy Cytotoxic cerebral edema Descending transtentorial herniation of brainstem Cerebellar tonsillar herniation Acute hypoxic respiratory failure Compromised airway Metabolic acidosis with lactic acidosis Hyperglycemia likely from acute stress Hypokalemia Leukocytosis  Plan: Continue vent support for now DNR Family to arrive and then will have further discussions about comfort measures - explained to patient's mother that she likely ill progressive to brain death in the near future  Critical care time 43 minutes  , MD [facility] Pulmonary/Critical Care Pager - [phone] 2021/02/05, 8:57 AM

## 2021-01-22 NOTE — Death Summary Note (Signed)
  Amanda Ballard was a 25 y.o. female smoker with history of heroin abuse and was recently in jail was found unresponsive after eating dinner with her friends.  She was given narcan and CPR started.  Noted to have PEA cardiac arrest by EMS.  Had ROSC about 10 minutes after EMS arrival.  Had [facility] airway changed to ETT in ER.  She remained unresponsive without need for sedation and pupils fixed and dilated.  UDS positive for benzodiazepine and amphetamines.  CT head showed cerebral edema with descending transtentorial herniation of brainstem and cerebellar tonsillar herniation with poor gray-white matter differentiation of the basal ganglia consistent with hypoxic-ischemic encephalopathy.  Family opted for comfort measures.  She was candidate for organ donation.  She expired on January 19, 2021 at 21:47.  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

## 2021-01-22 NOTE — Progress Notes (Signed)
Spoke with pt's mother at bedside and her siblings via Cater[employer] manager.  They understand that she has suffered a catastrophic neurologic insult from cardiac arrest and anoxic brain injury and will not improve.  They are in agreement to proceed with comfort measures.  They will have family gather and then notify medical team when they are ready to proceed.  [facility] donor services notified.  [name], MD [facility] Pulmonary/Critical Care Pager - [phone] 12/24/2020, 1:40 PM

## 2021-01-22 NOTE — Progress Notes (Addendum)
4:52pm - CH returned to check in on family; large number of family gathered in adjacent empty ICU room; pt.'s father and oldest brother bedside w/pt., grieving appropriately.  Father expressed the difficulty of feeling responsible for pt. as her father but also unable to control her choices.  Father requested prayer for pt. to experience God's peace. [place] met w/family in adjacent room following family meeting w/[facility] family support coordinator.  Family have agreed to organ donation later this evening.  Pt.'s mother, two sisters, grandmother, aunt, step-mother all present.  CH led family in prayer for God's comfort and strength per grandmother's request.  CH will follow up later this evening for honor walk or make referral to PM chaplain.  7:30pm - CH participated in moment of honor to commemorate family and patient's decision to pursue organ donation.  Family and [facility] staff gathered in pt.'s rm. as Kelli Churn family support coordinator led brief ceremony to honor pt.  [place] closed ceremony with brief prayer.  CH accompanied family on walk of honor from [facility] through 2nd floor hallway lined w/[facility] staff to OR.  Pt.'s mother Ivin Booty acutely distraught at door to OR and had to leave shortly after arriving.  CH accompanied family until they left [facility] a little while later.  Family grateful for support and also expressed gratitude for [facility] family support coordinator's compassionate care throughout afternoon's decisions.  Lindaann Pascal, Chaplain Pager: [phone]

## 2021-01-22 NOTE — Procedures (Signed)
Arterial Catheter Insertion Procedure Note  Amanda Ballard  [id number]  1996/03/10  Date:01/14/2021  Time:4:56 PM    Provider Performing: Elyn Peers    Procedure: Insertion of Arterial Line (00174) without US guidance  Indication(s) Blood pressure monitoring and/or need for frequent ABGs  Consent Risks of the procedure as well as the alternatives and risks of each were explained to the patient and/or caregiver.  Consent for the procedure was obtained and is signed in the bedside chart  Anesthesia None   Time Out Verified patient identification, verified procedure, site/side was marked, verified correct patient position, special equipment/implants available, medications/allergies/relevant history reviewed, required imaging and test results available.   Sterile Technique Maximal sterile technique including full sterile barrier drape, hand hygiene, sterile gown, sterile gloves, mask, hair covering, sterile ultrasound probe cover (if used).   Procedure Description Area of catheter insertion was cleaned with chlorhexidine and draped in sterile fashion. Without real-time ultrasound guidance an arterial catheter was placed into the right radial artery.  Appropriate arterial tracings confirmed on monitor.     Complications/Tolerance None; patient tolerated the procedure well.   EBL Minimal   Specimen(s) None

## 2021-01-22 NOTE — Progress Notes (Signed)
CH paged to provide support for pt.'s mother for family meeting w/MD as pt.'s prognosis is very poor.  Pt. lying in bed intubated and unresponsive w/mother Amanda Ballard at bedside and pt.'s brothers and sisters on videolink, all talking w/MD and RN re: pt.'s condition.  Mother says she does not want pt. to suffer; plan is to remove ventilator once family arrives.  CH remained present at bedside for some time after meeting; mother shared that pt. has a 6yo dtr. whom she has been guardian of since pt. began to struggle with drug addiction 70yrs ago.  CH excused himself when [facility] family coordinator arrived to meet w/mother.  Will follow up if possible later today.  Elpidio Anis, Chaplain Pager: [phone]

## 2021-01-22 DEATH — deceased

## 2021-03-13 ENCOUNTER — Telehealth: Payer: Self-pay | Admitting: Pulmonary Disease

## 2021-03-13 NOTE — Telephone Encounter (Signed)
Dr. Craige Cotta signed the letter originated by Amanda Ballard with [facility] regarding the death of this patient in the ED on 2021-01-31.  I faxed the signed letter back to Amanda at fax# [phone]
